# Patient Record
Sex: Female | Born: 1950 | Race: White | Hispanic: No | Smoking: Former smoker
Health system: Southern US, Community
[De-identification: ages and names within clinical notes are randomized; demographics above are authoritative.]

## PROBLEM LIST (undated history)

## (undated) DIAGNOSIS — C4490 Unspecified malignant neoplasm of skin, unspecified: Secondary | ICD-10-CM

## (undated) DIAGNOSIS — M810 Age-related osteoporosis without current pathological fracture: Secondary | ICD-10-CM

## (undated) HISTORY — DX: Age-related osteoporosis without current pathological fracture: M81.0

## (undated) HISTORY — DX: Unspecified malignant neoplasm of skin, unspecified: C44.90

---

## 1999-06-06 ENCOUNTER — Emergency Department (HOSPITAL_COMMUNITY): Admission: EM | Admit: 1999-06-06 | Discharge: 1999-06-06 | Payer: Self-pay | Admitting: Emergency Medicine

## 1999-06-06 ENCOUNTER — Encounter: Payer: Self-pay | Admitting: Emergency Medicine

## 2012-08-05 ENCOUNTER — Other Ambulatory Visit: Payer: Self-pay | Admitting: Family Medicine

## 2014-06-22 DIAGNOSIS — C50311 Malignant neoplasm of lower-inner quadrant of right female breast: Secondary | ICD-10-CM | POA: Insufficient documentation

## 2014-06-22 DIAGNOSIS — C50919 Malignant neoplasm of unspecified site of unspecified female breast: Secondary | ICD-10-CM

## 2014-06-22 HISTORY — DX: Malignant neoplasm of unspecified site of unspecified female breast: C50.919

## 2014-06-29 ENCOUNTER — Other Ambulatory Visit (INDEPENDENT_AMBULATORY_CARE_PROVIDER_SITE_OTHER): Payer: Self-pay | Admitting: General Surgery

## 2014-06-29 DIAGNOSIS — C50911 Malignant neoplasm of unspecified site of right female breast: Secondary | ICD-10-CM

## 2014-07-01 ENCOUNTER — Other Ambulatory Visit: Payer: Self-pay | Admitting: *Deleted

## 2014-07-01 ENCOUNTER — Telehealth: Payer: Self-pay | Admitting: *Deleted

## 2014-07-01 NOTE — Telephone Encounter (Signed)
Pt returned my call and I confirmed 07/05/14 genetic appt w/ her.

## 2014-07-01 NOTE — Telephone Encounter (Signed)
Received referral from Dr. Barry Dienes requesting a genetic appt.  Called and left a message for the pt to return my call so I can schedule it.

## 2014-07-05 ENCOUNTER — Ambulatory Visit (HOSPITAL_BASED_OUTPATIENT_CLINIC_OR_DEPARTMENT_OTHER): Payer: Medicare Other | Admitting: Genetic Counselor

## 2014-07-05 ENCOUNTER — Encounter: Payer: Self-pay | Admitting: Genetic Counselor

## 2014-07-05 ENCOUNTER — Other Ambulatory Visit: Payer: Medicare Other

## 2014-07-05 DIAGNOSIS — Z808 Family history of malignant neoplasm of other organs or systems: Secondary | ICD-10-CM

## 2014-07-05 DIAGNOSIS — C50919 Malignant neoplasm of unspecified site of unspecified female breast: Secondary | ICD-10-CM

## 2014-07-05 DIAGNOSIS — Z803 Family history of malignant neoplasm of breast: Secondary | ICD-10-CM

## 2014-07-05 DIAGNOSIS — Z800 Family history of malignant neoplasm of digestive organs: Secondary | ICD-10-CM

## 2014-07-05 DIAGNOSIS — C50911 Malignant neoplasm of unspecified site of right female breast: Secondary | ICD-10-CM | POA: Insufficient documentation

## 2014-07-05 DIAGNOSIS — IMO0002 Reserved for concepts with insufficient information to code with codable children: Secondary | ICD-10-CM

## 2014-07-05 NOTE — Progress Notes (Signed)
HISTORY OF PRESENT ILLNESS: Dr. Barry Blackburn requested a cancer genetics consultation for Vanessa Blackburn Blackburn, a 63 y.o. female, due to a personal and family history of cancer.  Vanessa Blackburn Blackburn presents to clinic today to discuss the possibility of a hereditary predisposition to cancer, genetic testing, and to further clarify her future cancer risks, as well as potential cancer risk for family members. Vanessa Blackburn Blackburn was recently diagnosed with right breast cancer (receptors, stage and type of cancer unknown and not available in today's records). She is currently scheduled to meet with several oncology providers to determine a treatment plan. She would like to use genetic test results to help make surgical decisions. 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 At today's visit, we primarily focused on Vanessa Blackburn Blackburn's family history.   FAMILY HISTORY:  During the visit, a 4-generation pedigree was obtained. Significant diagnoses include the following:  Family History  Problem Relation Age of Onset   Cancer Mother 56    female cancer - unknown type   Cancer Father 57    throat cancer; +cig and ETOH   Cancer Sister 77    breast   Cancer Brother 25    colorectal   Cancer Maternal Aunt     three maternal aunts with breast cancer at young ages   65 Paternal Aunt     breast cancer at an unknown age   57 Maternal Grandmother     breast cancer at a young age; stomach cancer at an older age   65 Maternal Grandfather     brain cancer at an older age   70 Paternal Grandmother     bone cancer at an older age   39 Sister 8    melanoma on back    Vanessa Blackburn Blackburn's ancestry is of Caucasian descent. There is no known Jewish ancestry or consanguinity.  GENETIC COUNSELING ASSESSMENT: Vanessa Blackburn Blackburn is a 63 y.o. female with a personal and family history of cancer highly suggestive of a hereditary predisposition  to cancer. We, therefore, discussed and recommended the following at today's visit.   DISCUSSION: We reviewed the characteristics, features and inheritance patterns of hereditary cancer syndromes. We also discussed genetic testing, including the appropriate family members to test, the process of testing, insurance coverage and turn-around-time for results. We discussed the implications of a negative, positive and/or variant of uncertain significant result. We recommended Vanessa Blackburn Blackburn pursue genetic testing for the BRCA1 and BCRA2 genes. If this testing is negative, we recommended she pursue genetic testing for the OvaNext gene panel which looks at additional genes associated with and increased risk for breast, colorectal, stomach and female cancers.   PLAN: Based on our above recommendation, Vanessa Blackburn Blackburn wished to pursue genetic testing and the blood sample was drawn and will be sent to [facility] for analysis. Results for BRCA1 and BRCA2 should be available within approximately 2 weeks time, at which point they will be disclosed by telephone to Vanessa Blackburn Blackburn, as will any additional recommendations warranted by these results. If gene panel testing is then pursued, this result will take an additional 2 weeks and will also be disclosed as soon as available. We also encouraged Vanessa Blackburn Blackburn to remain in contact with cancer genetics annually so that we can continuously update the family history and inform her of any changes in cancer genetics and testing that may be of benefit for this family.  [name] questions were answered to her satisfaction today. Our contact information was provided should additional questions or concerns arise.   Thank you for the referral and allowing [place] to share in the care of your patient.   The patient was seen for a total of 45 minutes in face-to-face genetic counseling.  This patient was discussed with Dr. Jana Blackburn who agrees with the above.     _______________________________________________________________________ For Office Staff:  Number of people involved in session: 2 Was an Intern/ student involved with case: not applicable

## 2014-07-06 ENCOUNTER — Ambulatory Visit: Payer: Self-pay | Admitting: Radiation Oncology

## 2014-07-17 ENCOUNTER — Encounter: Payer: Self-pay | Admitting: Genetic Counselor

## 2014-07-17 DIAGNOSIS — Z800 Family history of malignant neoplasm of digestive organs: Secondary | ICD-10-CM

## 2014-07-17 DIAGNOSIS — Z803 Family history of malignant neoplasm of breast: Secondary | ICD-10-CM

## 2014-07-17 DIAGNOSIS — C50919 Malignant neoplasm of unspecified site of unspecified female breast: Secondary | ICD-10-CM

## 2014-07-17 NOTE — Progress Notes (Signed)
Ms. Diefendorf recently had cancer genetic counseling at [facility] on July 05, 2014. At that time, it was recommended she pursue genetic testing. Her BRCA1 and BRCA2 gene test, which was performed at [facility], has returned and is negative for mutations. These results were disclosed to her today. Per her request, reflex testing for the OvaNext gene panel at [facility] was initiated. Results for the gene panel should be available in 2-3 more weeks and we will contact her to discuss these results and recommendations warranted by these results.

## 2014-08-02 ENCOUNTER — Encounter: Payer: Self-pay | Admitting: Genetic Counselor

## 2014-08-02 DIAGNOSIS — Z803 Family history of malignant neoplasm of breast: Secondary | ICD-10-CM

## 2014-08-02 DIAGNOSIS — C50919 Malignant neoplasm of unspecified site of unspecified female breast: Secondary | ICD-10-CM

## 2014-08-02 DIAGNOSIS — Z800 Family history of malignant neoplasm of digestive organs: Secondary | ICD-10-CM

## 2014-08-02 NOTE — Progress Notes (Signed)
HPI:  Ms. Pointer was previously seen in the [place] clinic due to a personal and family history of cancer and concerns regarding a hereditary predisposition to cancer. Please refer to our prior cancer genetics clinic note for more information regarding Ms. Tatro's medical, social and family histories, and our assessment and recommendations, at the time. Ms. Golliday recent genetic test results were disclosed to her, as were recommendations warranted by these results. These results and recommendations are discussed in more detail below.  GENETIC TEST RESULTS: At the time of Ms. Grawe's visit, we recommended she pursue genetic testing of the OvaNext gene panel. This test, which included sequencing and deletion/duplication analysis of the genes, was performed at [facility]. Genetic testing was normal, and did not reveal a deleterious mutation in these genes. A complete list of all genes tested is located on the test report scanned into [employer].    We discussed with Ms. Dillehay that since the current genetic testing is not perfect, it is possible there may be a gene mutation in one of these genes that current testing cannot detect, but that chance is small.  We also discussed, that it is possible that another gene that has not yet been discovered, or that we have not yet tested, is responsible for the cancer diagnoses in the family, and it is, therefore, important to remain in touch with cancer genetics in the future so that we can continue to offer Ms. Melick the most up to date genetic testing.   CANCER SCREENING RECOMMENDATIONS: This result is reassuring and suggests that Ms. Minervini's cancer was most likely not due to an inherited predisposition associated with one of these genes.  Most cancers happen by chance and this negative test, suggests that her cancer falls into this category.  We, therefore, recommended she continue to follow the cancer management and screening  guidelines provided by her oncology and primary providers.   RECOMMENDATIONS FOR FAMILY MEMBERS:  While these results are reassuring for Ms. Slayton and her children, this test does not tell us anything about Ms. Timothy's relatives' risks. It is possible there is a hereditary cancer syndrome in this family that Ms. Dewald did not inherit, rather she happened to get breast cancer by chance. We recommended these relatives (her siblings and maternal relatives) also have genetic counseling and testing. Please let us know if we can help facilitate testing. Genetic counselors can be located in other cities, by visiting the website of the [employer] ([employer]) and Field seismologist for a Dietitian by zip code.   FOLLOW-UP: Lastly, we discussed with Ms. Borello that cancer genetics is a rapidly advancing field and it is possible that new genetic tests will be appropriate for her and/or her family members in the future. We encouraged her to remain in contact with cancer genetics on an annual basis so we can update her personal and family histories and let her know of advances in cancer genetics that may benefit this family.   Our contact number was provided. Ms. Mayorga questions were answered to her satisfaction, and she knows she is welcome to call us at anytime with additional questions or concerns.   Catherine A. Fine, MS, CGC Certified Psychologist, sport and [email or web address]

## 2014-11-16 DIAGNOSIS — C439 Malignant melanoma of skin, unspecified: Secondary | ICD-10-CM | POA: Insufficient documentation

## 2014-12-27 DIAGNOSIS — D039 Melanoma in situ, unspecified: Secondary | ICD-10-CM | POA: Insufficient documentation

## 2014-12-27 DIAGNOSIS — D0352 Melanoma in situ of breast (skin) (soft tissue): Secondary | ICD-10-CM

## 2014-12-27 HISTORY — DX: Melanoma in situ of breast (skin) (soft tissue): D03.52

## 2015-01-17 ENCOUNTER — Telehealth: Payer: Self-pay | Admitting: Genetic Counselor

## 2015-01-17 NOTE — Telephone Encounter (Signed)
Daughter, Manfred Shirts called to get information about her mother's genetic test result.  Patient was seen by Leda Quail.  Daughter is asking whether more extensive genetic testing was performed than just BRCA1 and BRCA2 testing.  Confirmed that an ovanext panel test was performed.  Tried to route the test result to Dr. Hinton Rao, asking that she please contact me if she is unable to see that result.

## 2015-08-24 ENCOUNTER — Other Ambulatory Visit: Payer: Self-pay | Admitting: Rheumatology

## 2015-08-24 ENCOUNTER — Ambulatory Visit
Admission: RE | Admit: 2015-08-24 | Discharge: 2015-08-24 | Disposition: A | Discharge status: Home / Self Care | Payer: Medicare Other | Source: Ambulatory Visit | Attending: Rheumatology | Admitting: Rheumatology

## 2015-08-24 DIAGNOSIS — M545 Low back pain: Secondary | ICD-10-CM

## 2015-10-26 DIAGNOSIS — C4490 Unspecified malignant neoplasm of skin, unspecified: Secondary | ICD-10-CM

## 2015-10-26 DIAGNOSIS — M8580 Other specified disorders of bone density and structure, unspecified site: Secondary | ICD-10-CM

## 2015-10-26 DIAGNOSIS — I890 Lymphedema, not elsewhere classified: Secondary | ICD-10-CM

## 2015-10-26 DIAGNOSIS — C50911 Malignant neoplasm of unspecified site of right female breast: Secondary | ICD-10-CM | POA: Diagnosis not present

## 2015-10-26 DIAGNOSIS — D0359 Melanoma in situ of other part of trunk: Secondary | ICD-10-CM | POA: Diagnosis not present

## 2015-10-26 DIAGNOSIS — C4352 Malignant melanoma of skin of breast: Secondary | ICD-10-CM | POA: Diagnosis not present

## 2015-10-26 DIAGNOSIS — C773 Secondary and unspecified malignant neoplasm of axilla and upper limb lymph nodes: Secondary | ICD-10-CM | POA: Diagnosis not present

## 2016-04-29 DIAGNOSIS — C4490 Unspecified malignant neoplasm of skin, unspecified: Secondary | ICD-10-CM | POA: Diagnosis not present

## 2016-04-29 DIAGNOSIS — D0359 Melanoma in situ of other part of trunk: Secondary | ICD-10-CM | POA: Diagnosis not present

## 2016-04-29 DIAGNOSIS — Z87891 Personal history of nicotine dependence: Secondary | ICD-10-CM

## 2016-04-29 DIAGNOSIS — I890 Lymphedema, not elsewhere classified: Secondary | ICD-10-CM | POA: Diagnosis not present

## 2016-04-29 DIAGNOSIS — M8580 Other specified disorders of bone density and structure, unspecified site: Secondary | ICD-10-CM

## 2016-04-29 DIAGNOSIS — C50411 Malignant neoplasm of upper-outer quadrant of right female breast: Secondary | ICD-10-CM | POA: Diagnosis not present

## 2016-08-28 DIAGNOSIS — R609 Edema, unspecified: Secondary | ICD-10-CM | POA: Diagnosis not present

## 2016-08-28 DIAGNOSIS — Z85820 Personal history of malignant melanoma of skin: Secondary | ICD-10-CM

## 2016-08-28 DIAGNOSIS — C50311 Malignant neoplasm of lower-inner quadrant of right female breast: Secondary | ICD-10-CM | POA: Diagnosis not present

## 2016-08-28 DIAGNOSIS — M8580 Other specified disorders of bone density and structure, unspecified site: Secondary | ICD-10-CM | POA: Diagnosis not present

## 2016-08-28 DIAGNOSIS — Z170 Estrogen receptor positive status [ER+]: Secondary | ICD-10-CM

## 2016-09-26 ENCOUNTER — Encounter: Payer: Self-pay | Admitting: Sports Medicine

## 2016-09-26 ENCOUNTER — Ambulatory Visit (INDEPENDENT_AMBULATORY_CARE_PROVIDER_SITE_OTHER): Payer: Medicare Other

## 2016-09-26 ENCOUNTER — Ambulatory Visit (INDEPENDENT_AMBULATORY_CARE_PROVIDER_SITE_OTHER): Payer: Medicare Other | Admitting: Sports Medicine

## 2016-09-26 DIAGNOSIS — M79672 Pain in left foot: Secondary | ICD-10-CM

## 2016-09-26 DIAGNOSIS — S93402A Sprain of unspecified ligament of left ankle, initial encounter: Secondary | ICD-10-CM

## 2016-09-26 DIAGNOSIS — S93502A Unspecified sprain of left great toe, initial encounter: Secondary | ICD-10-CM | POA: Diagnosis not present

## 2016-09-26 NOTE — Progress Notes (Signed)
Subjective: Vanessa Blackburn is a 65 y.o. female patient who presents to office for evaluation of Left foot pain. Patient states that 2 months ago she injured her foot by tripping and falling and then re-injured it 2-3 weeks ago when she twisted her ankle while hanging a picture. States that she went to see her PCP who gave her naproxen with no relief. However, with time, has gotten better. Reports that rest has helped and soaking and now feels better. Denies any other pedal complaints.   Patient Active Problem List   Diagnosis Date Noted  . Melanoma ([place]) 11/16/2014  . Malignant neoplasm of right breast ([place]) 07/05/2014  . Family history of malignant neoplasm of gastrointestinal tract 07/05/2014  . Family history of malignant neoplasm of breast 07/05/2014    No current outpatient prescriptions on file prior to visit.   No current facility-administered medications on file prior to visit.     Allergies  Allergen Reactions  . Ciprofloxacin Other (See Comments)    unknown    Objective:  General: Alert and oriented x3 in no acute distress  Dermatology: No open lesions bilateral lower extremities, no webspace macerations, no ecchymosis bilateral, all nails x 10 are well manicured.  Vascular: Dorsalis Pedis and Posterior Tibial pedal pulses palpable, Capillary Fill Time 3 seconds,Scant pedal hair growth bilateral, no edema bilateral lower extremities, Temperature gradient within normal limits.  Neurology: Johney Maine sensation intact via light touch bilateral, Protective sensation intact with Thornell Mule Monofilament to all pedal sites, Position sense intact, vibratory intact bilateral, Deep tendon reflexes within normal limits bilateral, No babinski sign present bilateral. (- )Tinels sign bilateral.   Musculoskeletal: No reproducible tenderness to palpation of left foot or ankle at this time,No pain with calf compression bilateral. There is decreased ankle rom with knee extending  vs flexed  resembling gastroc equnius bilateral, Subtalar joint range of motion is within normal limits, there is no 1st ray hypermobility noted bilateral, decreased 1st MPJ rom bilateral with functional limitus and mild lesser hammertoe noted on weightbearing exam. Strength within normal limits in all groups bilateral.   Gait: non-antalgic gait  Xrays  Left Foot   Impression: Decrease osseous mineralization. There is a very small chip fracture at the medial aspect of the base of the proximal phalanx of the hallux with no gross displacement or extraosseous involvement. There is mild lesser hammertoe after she is within normal limits. No other acute findings.  Assessment and Plan: Problem List Items Addressed This Visit    None    Visit Diagnoses    Left foot pain    -  Primary   Relevant Orders   DG Foot 2 Views Left   Sprain of left ankle, unspecified ligament, initial encounter       Resolved   Sprain of left great toe, initial encounter       Resolved with small chip fracture on xray       -Complete examination performed -Xrays reviewed -Discussed treatement optionsFor likely sprain at ankle and great big toe which is now resolved -Recommend protection, rest, ice, elevation to prevent reoccurrence of symptoms -Recommend good supportive shoes to prevent recurrence of symptoms -Patient to return to office as needed or sooner if condition worsens.  Landis Martins, DPM

## 2017-02-24 DIAGNOSIS — Z86008 Personal history of in-situ neoplasm of other site: Secondary | ICD-10-CM | POA: Diagnosis not present

## 2017-02-24 DIAGNOSIS — Z85820 Personal history of malignant melanoma of skin: Secondary | ICD-10-CM | POA: Diagnosis not present

## 2017-02-24 DIAGNOSIS — C50911 Malignant neoplasm of unspecified site of right female breast: Secondary | ICD-10-CM | POA: Diagnosis not present

## 2017-02-24 DIAGNOSIS — Z923 Personal history of irradiation: Secondary | ICD-10-CM | POA: Diagnosis not present

## 2017-02-24 DIAGNOSIS — I890 Lymphedema, not elsewhere classified: Secondary | ICD-10-CM | POA: Diagnosis not present

## 2017-02-24 DIAGNOSIS — M810 Age-related osteoporosis without current pathological fracture: Secondary | ICD-10-CM | POA: Diagnosis not present

## 2017-02-24 DIAGNOSIS — Z9221 Personal history of antineoplastic chemotherapy: Secondary | ICD-10-CM | POA: Diagnosis not present

## 2017-06-20 DIAGNOSIS — Z86008 Personal history of in-situ neoplasm of other site: Secondary | ICD-10-CM | POA: Diagnosis not present

## 2017-06-20 DIAGNOSIS — Z85820 Personal history of malignant melanoma of skin: Secondary | ICD-10-CM | POA: Diagnosis not present

## 2017-06-20 DIAGNOSIS — Z9221 Personal history of antineoplastic chemotherapy: Secondary | ICD-10-CM | POA: Diagnosis not present

## 2017-06-20 DIAGNOSIS — Z923 Personal history of irradiation: Secondary | ICD-10-CM | POA: Diagnosis not present

## 2017-06-20 DIAGNOSIS — R590 Localized enlarged lymph nodes: Secondary | ICD-10-CM | POA: Diagnosis not present

## 2017-06-20 DIAGNOSIS — Z79811 Long term (current) use of aromatase inhibitors: Secondary | ICD-10-CM | POA: Diagnosis not present

## 2017-06-20 DIAGNOSIS — C50911 Malignant neoplasm of unspecified site of right female breast: Secondary | ICD-10-CM | POA: Diagnosis not present

## 2017-06-20 DIAGNOSIS — M810 Age-related osteoporosis without current pathological fracture: Secondary | ICD-10-CM | POA: Diagnosis not present

## 2017-06-20 DIAGNOSIS — I10 Essential (primary) hypertension: Secondary | ICD-10-CM

## 2017-06-20 DIAGNOSIS — F068 Other specified mental disorders due to known physiological condition: Secondary | ICD-10-CM | POA: Diagnosis not present

## 2017-10-23 DIAGNOSIS — Z85820 Personal history of malignant melanoma of skin: Secondary | ICD-10-CM

## 2017-10-23 DIAGNOSIS — R222 Localized swelling, mass and lump, trunk: Secondary | ICD-10-CM | POA: Diagnosis not present

## 2017-10-23 DIAGNOSIS — I890 Lymphedema, not elsewhere classified: Secondary | ICD-10-CM

## 2017-10-23 DIAGNOSIS — Z923 Personal history of irradiation: Secondary | ICD-10-CM

## 2017-10-23 DIAGNOSIS — Z86008 Personal history of in-situ neoplasm of other site: Secondary | ICD-10-CM | POA: Diagnosis not present

## 2017-10-23 DIAGNOSIS — R410 Disorientation, unspecified: Secondary | ICD-10-CM

## 2017-10-23 DIAGNOSIS — C50911 Malignant neoplasm of unspecified site of right female breast: Secondary | ICD-10-CM | POA: Diagnosis not present

## 2017-10-23 DIAGNOSIS — Z9221 Personal history of antineoplastic chemotherapy: Secondary | ICD-10-CM

## 2017-10-23 DIAGNOSIS — Z79811 Long term (current) use of aromatase inhibitors: Secondary | ICD-10-CM | POA: Diagnosis not present

## 2017-10-23 DIAGNOSIS — M810 Age-related osteoporosis without current pathological fracture: Secondary | ICD-10-CM | POA: Diagnosis not present

## 2018-02-19 DIAGNOSIS — Z86008 Personal history of in-situ neoplasm of other site: Secondary | ICD-10-CM | POA: Diagnosis not present

## 2018-02-19 DIAGNOSIS — M810 Age-related osteoporosis without current pathological fracture: Secondary | ICD-10-CM

## 2018-02-19 DIAGNOSIS — I890 Lymphedema, not elsewhere classified: Secondary | ICD-10-CM | POA: Diagnosis not present

## 2018-02-19 DIAGNOSIS — Z85820 Personal history of malignant melanoma of skin: Secondary | ICD-10-CM | POA: Diagnosis not present

## 2018-02-19 DIAGNOSIS — Z85828 Personal history of other malignant neoplasm of skin: Secondary | ICD-10-CM | POA: Diagnosis not present

## 2018-02-19 DIAGNOSIS — Z79811 Long term (current) use of aromatase inhibitors: Secondary | ICD-10-CM | POA: Diagnosis not present

## 2018-02-19 DIAGNOSIS — C50911 Malignant neoplasm of unspecified site of right female breast: Secondary | ICD-10-CM

## 2018-02-19 DIAGNOSIS — Z923 Personal history of irradiation: Secondary | ICD-10-CM | POA: Diagnosis not present

## 2018-02-19 DIAGNOSIS — Z9221 Personal history of antineoplastic chemotherapy: Secondary | ICD-10-CM

## 2018-05-08 ENCOUNTER — Ambulatory Visit (INDEPENDENT_AMBULATORY_CARE_PROVIDER_SITE_OTHER): Payer: Medicare HMO | Admitting: Orthopaedic Surgery

## 2018-05-08 ENCOUNTER — Ambulatory Visit (INDEPENDENT_AMBULATORY_CARE_PROVIDER_SITE_OTHER): Payer: Medicare HMO

## 2018-05-08 DIAGNOSIS — M5441 Lumbago with sciatica, right side: Secondary | ICD-10-CM | POA: Diagnosis not present

## 2018-05-08 DIAGNOSIS — M5442 Lumbago with sciatica, left side: Secondary | ICD-10-CM

## 2018-05-08 DIAGNOSIS — G8929 Other chronic pain: Secondary | ICD-10-CM | POA: Diagnosis not present

## 2018-05-08 NOTE — Progress Notes (Signed)
Office Visit Note   [name]           Date of Birth: Dec 12, 1950           MRN: [id number] Visit Date: 05/08/2018              Requested by: Gavin Pound, [address] [place], [place] [address] PCP: System, Pcp Not In   Assessment & Plan: Visit Diagnoses:  1. Chronic bilateral low back pain with bilateral sciatica     Plan: Impression is lumbar spinal stenosis and neurogenic claudication.  Recommend MRI to evaluate for spinal stenosis.  Follow-up after the MRI.  Follow-Up Instructions: Return in about 2 weeks (around 05/22/2018).   Orders:  Orders Placed This Encounter  Procedures  . XR Lumbar Spine 2-3 Views  . MR Lumbar Spine w/o contrast   No orders of the defined types were placed in this encounter.     Procedures: No procedures performed   Clinical Data: No additional findings.   Subjective: Chief Complaint  Patient presents with  . Lower Back - Pain    Patient is a female who comes in with low back pain with occasional radiation down her legs for many years with recent worsening.  She endorses a positive grocery cart sign.  She has tried physical therapy without any significant relief or improvement.  She denies any bowel bladder dysfunction but does endorse neurogenic patient.   Review of Systems  Constitutional: Negative.   HENT: Negative.   Eyes: Negative.   Respiratory: Negative.   Cardiovascular: Negative.   Endocrine: Negative.   Musculoskeletal: Negative.   Neurological: Negative.   Hematological: Negative.   Psychiatric/Behavioral: Negative.   All other systems reviewed and are negative.    Objective: Vital Signs: There were no vitals taken for this visit.  Physical Exam  Constitutional: She is oriented to person, place, and time. She appears well-developed and well-nourished.  HENT:  Head: Normocephalic and atraumatic.  Eyes: EOM are normal.  Neck: Neck supple.  Pulmonary/Chest: Effort normal.    Abdominal: Soft.  Neurological: She is alert and oriented to person, place, and time.  Skin: Skin is warm. Capillary refill takes less than 2 seconds.  Psychiatric: She has a normal mood and affect. Her behavior is normal. Judgment and thought content normal.  Nursing note and vitals reviewed.   Ortho Exam Bilateral lower extremity exam shows normal and symmetric reflexes.  No spasticity.  No focal motor or sensory deficits.  Lumbar spine is tender. Specialty Comments:  No specialty comments available.  Imaging: Xr Lumbar Spine 2-3 Views  Result Date: 05/08/2018 L5-S1 grade 1-2 spondylolisthesis    PMFS History: Patient Active Problem List   Diagnosis Date Noted  . Melanoma ([place]) 11/16/2014  . Malignant neoplasm of right breast ([place]) 07/05/2014  . Family history of malignant neoplasm of gastrointestinal tract 07/05/2014  . Family history of malignant neoplasm of breast 07/05/2014   No past medical history on file.  Family History  Problem Relation Age of Onset  . Cancer Mother 64       female cancer - unknown type  . Cancer Father 58       throat cancer; +cig and ETOH  . Cancer Sister 31       breast  . Cancer Brother 65       colorectal  . Cancer Maternal Aunt        three maternal aunts with breast cancer at young  ages  . Cancer Paternal Aunt        breast cancer at an unknown age  . Cancer Maternal Grandmother        breast cancer at a young age; stomach cancer at an older age  . Cancer Maternal Grandfather        brain cancer at an older age  . Cancer Paternal Grandmother        bone cancer at an older age  . Cancer Sister 36       melanoma on back     Social History   Occupational History  . Not on file  Tobacco Use  . Smoking status: Unknown If Ever Smoked  Substance and Sexual Activity  . Alcohol use: Not on file  . Drug use: Not on file  . Sexual activity: Not on file

## 2018-05-23 ENCOUNTER — Ambulatory Visit
Admission: RE | Admit: 2018-05-23 | Discharge: 2018-05-23 | Disposition: A | Discharge status: Home / Self Care | Payer: Medicare HMO | Source: Ambulatory Visit | Attending: Orthopaedic Surgery | Admitting: Orthopaedic Surgery

## 2018-05-23 DIAGNOSIS — M5441 Lumbago with sciatica, right side: Principal | ICD-10-CM

## 2018-05-23 DIAGNOSIS — M5442 Lumbago with sciatica, left side: Principal | ICD-10-CM

## 2018-05-23 DIAGNOSIS — G8929 Other chronic pain: Secondary | ICD-10-CM

## 2018-06-02 ENCOUNTER — Ambulatory Visit (INDEPENDENT_AMBULATORY_CARE_PROVIDER_SITE_OTHER): Payer: Medicare HMO | Admitting: Orthopaedic Surgery

## 2018-06-02 ENCOUNTER — Encounter (INDEPENDENT_AMBULATORY_CARE_PROVIDER_SITE_OTHER): Payer: Self-pay | Admitting: Orthopaedic Surgery

## 2018-06-02 DIAGNOSIS — G8929 Other chronic pain: Secondary | ICD-10-CM | POA: Diagnosis not present

## 2018-06-02 DIAGNOSIS — M5441 Lumbago with sciatica, right side: Secondary | ICD-10-CM

## 2018-06-02 DIAGNOSIS — M5442 Lumbago with sciatica, left side: Secondary | ICD-10-CM | POA: Diagnosis not present

## 2018-06-02 NOTE — Addendum Note (Signed)
Addended by: Precious Bard on: 06/02/2018 04:20 PM   Modules accepted: Orders

## 2018-06-02 NOTE — Progress Notes (Signed)
   Office Visit Note   [name]           Date of Birth: 12/03/1950           MRN: [id number] Visit Date: 06/02/2018              Requested by: No referring provider defined for this encounter. PCP: Enid Skeens., MD   Assessment & Plan: Visit Diagnoses:  1. Chronic bilateral low back pain with bilateral sciatica     Plan: MRI findings are significant for L5-S1 anterolisthesis and resultant canal stenosis.  She has facet disease at L4-L5 and L5-S1.  These findings were discussed with the patient and recommendation is to try epidural steroid injection to see if this will alleviate her symptoms.  She is in agreement.  Questions encouraged and answered.  Follow-up as needed with me.  Follow-Up Instructions: Return if symptoms worsen or fail to improve.   Orders:  No orders of the defined types were placed in this encounter.  No orders of the defined types were placed in this encounter.     Procedures: No procedures performed   Clinical Data: No additional findings.   Subjective: Chief Complaint  Patient presents with  . Lower Back - Pain    Vanessa Blackburn follows up today for MRI review.  She continues to have low back pain.   Review of Systems   Objective: Vital Signs: There were no vitals taken for this visit.  Physical Exam  Ortho Exam Exam is stable. Specialty Comments:  No specialty comments available.  Imaging: No results found.   PMFS History: Patient Active Problem List   Diagnosis Date Noted  . Melanoma ([place]) 11/16/2014  . Malignant neoplasm of right breast ([place]) 07/05/2014  . Family history of malignant neoplasm of gastrointestinal tract 07/05/2014  . Family history of malignant neoplasm of breast 07/05/2014   History reviewed. No pertinent past medical history.  Family History  Problem Relation Age of Onset  . Cancer Mother 1       female cancer - unknown type  . Cancer Father 47       throat cancer; +cig and ETOH  .  Cancer Sister 42       breast  . Cancer Brother 8       colorectal  . Cancer Maternal Aunt        three maternal aunts with breast cancer at young ages  . Cancer Paternal Aunt        breast cancer at an unknown age  . Cancer Maternal Grandmother        breast cancer at a young age; stomach cancer at an older age  . Cancer Maternal Grandfather        brain cancer at an older age  . Cancer Paternal Grandmother        bone cancer at an older age  . Cancer Sister 40       melanoma on back    History reviewed. No pertinent surgical history. Social History   Occupational History  . Not on file  Tobacco Use  . Smoking status: Unknown If Ever Smoked  . Smokeless tobacco: Never Used  Substance and Sexual Activity  . Alcohol use: Not on file  . Drug use: Not on file  . Sexual activity: Not on file

## 2018-06-15 ENCOUNTER — Ambulatory Visit (INDEPENDENT_AMBULATORY_CARE_PROVIDER_SITE_OTHER): Payer: Self-pay

## 2018-06-15 ENCOUNTER — Encounter (INDEPENDENT_AMBULATORY_CARE_PROVIDER_SITE_OTHER): Payer: Self-pay | Admitting: Physical Medicine and Rehabilitation

## 2018-06-15 ENCOUNTER — Ambulatory Visit (INDEPENDENT_AMBULATORY_CARE_PROVIDER_SITE_OTHER): Payer: Medicare HMO | Admitting: Physical Medicine and Rehabilitation

## 2018-06-15 ENCOUNTER — Encounter

## 2018-06-15 VITALS — BP 161/87 | HR 86

## 2018-06-15 DIAGNOSIS — M5416 Radiculopathy, lumbar region: Secondary | ICD-10-CM | POA: Diagnosis not present

## 2018-06-15 MED ORDER — BETAMETHASONE SOD PHOS & ACET 6 (3-3) MG/ML IJ SUSP
12.0000 mg | Freq: Once | INTRAMUSCULAR | Status: AC
  Administered 2018-06-15: 12 mg

## 2018-06-15 NOTE — Patient Instructions (Signed)

## 2018-06-15 NOTE — Progress Notes (Signed)
 .  Numeric Pain Rating Scale and Functional Assessment Average Pain 8   In the last MONTH (on 0-10 scale) has pain interfered with the following?  1. General activity like being  able to carry out your everyday physical activities such as walking, climbing stairs, carrying groceries, or moving a chair?  Rating(6)   +Driver, -BT, -Dye Allergies.  

## 2018-06-29 NOTE — Progress Notes (Signed)
Vanessa Blackburn - 67 y.o. female MRN [id number]  Date of birth: 09/06/1951  Office Visit Note: Visit Date: 06/15/2018 PCP: Enid Skeens., MD Referred by: Enid Skeens., MD  Subjective: Chief Complaint  Patient presents with  . Lower Back - Pain  . Right Leg - Numbness, Pain  . Left Leg - Numbness, Pain   HPI: [name] is a 67 year old female with chronic worsening, severe 8 out of 10 pain in the lower back and buttocks that radiates into both legs equally with numbness in a somewhat nondermatomal distribution and somewhat mainly L5 distribution down the posterior lateral legs.  She reports this is been ongoing for many years.  She reports that standing too long makes it much worse and sitting seems to help.  MRI shows significant 9 mm anterior listhesis of L5 on S1 with significant facet arthropathy and stenosis both centrally in the lateral recess and foramen.  Her history is complicated by history of breast cancer GI cancer and melanoma.  We are going to complete diagnostic and hopefully therapeutic bilateral L5 transforaminal injection.   ROS Otherwise per HPI.  Assessment & Plan: Visit Diagnoses:  1. Lumbar radiculopathy     Plan: No additional findings.   Meds & Orders:  Meds ordered this encounter  Medications  . betamethasone acetate-betamethasone sodium phosphate (CELESTONE) injection 12 mg    Orders Placed This Encounter  Procedures  . XR C-ARM NO REPORT  . Epidural Steroid injection    Follow-up: Return if symptoms worsen or fail to improve.   Procedures: No procedures performed  Lumbosacral Transforaminal Epidural Steroid Injection - Sub-Pedicular Approach with Fluoroscopic Guidance  [name]      Date of Birth: 04/28/1951 MRN: [id number] PCP: Enid Skeens., MD      Visit Date: 06/15/2018   Universal Protocol:    Date/Time: 06/15/2018  Consent Given By: the patient  Position: PRONE  Additional Comments: Vital signs were  monitored before and after the procedure. Patient was prepped and draped in the usual sterile fashion. The correct patient, procedure, and site was verified.   Injection Procedure Details:  Procedure Site One Meds Administered:  Meds ordered this encounter  Medications  . betamethasone acetate-betamethasone sodium phosphate (CELESTONE) injection 12 mg    Laterality: Bilateral  Location/Site:  L5-S1  Needle size: 22 G  Needle type: Spinal  Needle Placement: Transforaminal  Findings:    -Comments: Excellent flow of contrast along the nerve and into the epidural space.  Procedure Details: After squaring off the end-plates to get a true AP view, the C-arm was positioned so that an oblique view of the foramen as noted above was visualized. The target area is just inferior to the "nose of the scotty dog" or sub pedicular. The soft tissues overlying this structure were infiltrated with 2-3 ml. of 1% Lidocaine without Epinephrine.  The spinal needle was inserted toward the target using a "trajectory" view along the fluoroscope beam.  Under AP and lateral visualization, the needle was advanced so it did not puncture dura and was located close the 6 O'Clock position of the pedical in AP tracterory. Biplanar projections were used to confirm position. Aspiration was confirmed to be negative for CSF and/or blood. A 1-2 ml. volume of Isovue-250 was injected and flow of contrast was noted at each level. Radiographs were obtained for documentation purposes.   After attaining the desired flow of contrast documented above, a 0.5 to 1.0 ml test dose of 0.25%  Marcaine was injected into each respective transforaminal space.  The patient was observed for 90 seconds post injection.  After no sensory deficits were reported, and normal lower extremity motor function was noted,   the above injectate was administered so that equal amounts of the injectate were placed at each foramen (level) into the  transforaminal epidural space.   Additional Comments:  The patient tolerated the procedure well Dressing: Band-Aid    Post-procedure details: Patient was observed during the procedure. Post-procedure instructions were reviewed.  Patient left the clinic in stable condition.    Clinical History: MRI LUMBAR SPINE WITHOUT CONTRAST  TECHNIQUE: Multiplanar, multisequence MR imaging of the lumbar spine was performed. No intravenous contrast was administered.  COMPARISON:  Radiography 05/08/2018.  CT abdomen 05/10/2015.  FINDINGS: Segmentation:  5 lumbar type vertebral bodies.  Alignment:  9 mm anterolisthesis L5-S1.  Vertebrae:  No fracture or primary bone lesion.  Conus medullaris and cauda equina: Conus extends to the L1 level. Conus and cauda equina appear normal.  Paraspinal and other soft tissues: Negative  Disc levels:  No significant finding at L3-4 or above. No canal or foraminal stenosis.  L4-5: Minimal noncompressive disc bulge. Facet osteoarthritis with mild edema which could contribute to back pain. No compressive stenosis.  L5-S1: Chronic bilateral facet arthropathy with 9 mm of anterolisthesis. Bulging of the disc. Canal and foraminal stenosis that could cause neural compression on either or both sides. No pars defect was seen on the CT scan of 2016, but it is possible that there could be acquired pars deficiency in this patient with chronic facet arthropathy and slippage.  IMPRESSION: L4-5: No stenosis. Facet osteoarthritis with mild edema that could be symptomatic.  L5-S1: Advanced bilateral facet arthropathy with 9 mm of anterolisthesis. Bulging of the disc. Stenosis of the canal and foramina that could cause neural compression on either or both sides and could certainly relate to low back pain. I think the primary process is that of facet arthropathy rather than primary pars defect, but it is possible there could be an acquired pars  fracture in addition to the facet arthropathy.   Electronically Signed   By: Nelson Chimes M.D.   On: 05/23/2018 16:55   [name] No results for input(s): HGBA1C, LABURIC in the last 8760 hours.  Objective:  VS:  HT:    WT:   BMI:     BP:(!) 161/87  HR:86bpm  TEMP: ( )  RESP:  Physical Exam  Ortho Exam Imaging: No results found.  Past Medical/Family/Surgical/Social History: Medications & Allergies reviewed per EMR, new medications updated. Patient Active Problem List   Diagnosis Date Noted  . Melanoma ([place]) 11/16/2014  . Malignant neoplasm of right breast ([place]) 07/05/2014  . Family history of malignant neoplasm of gastrointestinal tract 07/05/2014  . Family history of malignant neoplasm of breast 07/05/2014   History reviewed. No pertinent past medical history. Family History  Problem Relation Age of Onset  . Cancer Mother 32       female cancer - unknown type  . Cancer Father 55       throat cancer; +cig and ETOH  . Cancer Sister 73       breast  . Cancer Brother 74       colorectal  . Cancer Maternal Aunt        three maternal aunts with breast cancer at young ages  . Cancer Paternal Aunt  breast cancer at an unknown age  . Cancer Maternal Grandmother        breast cancer at a young age; stomach cancer at an older age  . Cancer Maternal Grandfather        brain cancer at an older age  . Cancer Paternal Grandmother        bone cancer at an older age  . Cancer Sister 16       melanoma on back   History reviewed. No pertinent surgical history. Social History   Occupational History  . Not on file  Tobacco Use  . Smoking status: Unknown If Ever Smoked  . Smokeless tobacco: Never Used  Substance and Sexual Activity  . Alcohol use: Not on file  . Drug use: Not on file  . Sexual activity: Not on file

## 2018-06-29 NOTE — Procedures (Signed)
Lumbosacral Transforaminal Epidural Steroid Injection - Sub-Pedicular Approach with Fluoroscopic Guidance  [name]      Date of Birth: 01-08-1951 MRN: [id number] PCP: Enid Skeens., MD      Visit Date: 06/15/2018   Universal Protocol:    Date/Time: 06/15/2018  Consent Given By: the patient  Position: PRONE  Additional Comments: Vital signs were monitored before and after the procedure. Patient was prepped and draped in the usual sterile fashion. The correct patient, procedure, and site was verified.   Injection Procedure Details:  Procedure Site One Meds Administered:  Meds ordered this encounter  Medications  . betamethasone acetate-betamethasone sodium phosphate (CELESTONE) injection 12 mg    Laterality: Bilateral  Location/Site:  L5-S1  Needle size: 22 G  Needle type: Spinal  Needle Placement: Transforaminal  Findings:    -Comments: Excellent flow of contrast along the nerve and into the epidural space.  Procedure Details: After squaring off the end-plates to get a true AP view, the C-arm was positioned so that an oblique view of the foramen as noted above was visualized. The target area is just inferior to the "nose of the scotty dog" or sub pedicular. The soft tissues overlying this structure were infiltrated with 2-3 ml. of 1% Lidocaine without Epinephrine.  The spinal needle was inserted toward the target using a "trajectory" view along the fluoroscope beam.  Under AP and lateral visualization, the needle was advanced so it did not puncture dura and was located close the 6 O'Clock position of the pedical in AP tracterory. Biplanar projections were used to confirm position. Aspiration was confirmed to be negative for CSF and/or blood. A 1-2 ml. volume of Isovue-250 was injected and flow of contrast was noted at each level. Radiographs were obtained for documentation purposes.   After attaining the desired flow of contrast documented above, a  0.5 to 1.0 ml test dose of 0.25% Marcaine was injected into each respective transforaminal space.  The patient was observed for 90 seconds post injection.  After no sensory deficits were reported, and normal lower extremity motor function was noted,   the above injectate was administered so that equal amounts of the injectate were placed at each foramen (level) into the transforaminal epidural space.   Additional Comments:  The patient tolerated the procedure well Dressing: Band-Aid    Post-procedure details: Patient was observed during the procedure. Post-procedure instructions were reviewed.  Patient left the clinic in stable condition.

## 2018-10-22 ENCOUNTER — Telehealth (INDEPENDENT_AMBULATORY_CARE_PROVIDER_SITE_OTHER): Payer: Self-pay | Admitting: Physical Medicine and Rehabilitation

## 2018-10-22 NOTE — Telephone Encounter (Signed)
Auth No / Request ID [id number] Status Auto-Approved Decision Approved Effective Date 10/26/2018 Expiration Date 12/10/2018

## 2018-10-22 NOTE — Telephone Encounter (Signed)
Okay to repeat.

## 2018-10-22 NOTE — Telephone Encounter (Signed)
Pt is scheduled for 11/05/2018 with driver.

## 2018-10-22 NOTE — Telephone Encounter (Signed)
Needs auth and scheduling for bilateral [id number]. 

## 2018-10-28 ENCOUNTER — Encounter: Payer: Self-pay | Admitting: Sports Medicine

## 2018-10-28 ENCOUNTER — Ambulatory Visit (INDEPENDENT_AMBULATORY_CARE_PROVIDER_SITE_OTHER): Payer: Medicare HMO | Admitting: Sports Medicine

## 2018-10-28 ENCOUNTER — Ambulatory Visit (INDEPENDENT_AMBULATORY_CARE_PROVIDER_SITE_OTHER): Payer: Medicare HMO

## 2018-10-28 DIAGNOSIS — S93401A Sprain of unspecified ligament of right ankle, initial encounter: Secondary | ICD-10-CM | POA: Diagnosis not present

## 2018-10-28 DIAGNOSIS — M25471 Effusion, right ankle: Secondary | ICD-10-CM | POA: Diagnosis not present

## 2018-10-28 DIAGNOSIS — M79671 Pain in right foot: Secondary | ICD-10-CM

## 2018-10-28 NOTE — Progress Notes (Signed)
Subjective: Vanessa Blackburn is a 68 y.o. female patient who presents to office for evaluation of right ankle pain. Patient complains of continued pain in the ankle since 1 week states that she was walking her dog and jumped with the leash. Patient has tried ice and elevation with no relief in symptoms.  Reports pain is worse when she is attempting to walk and put pressure on her foot and ankle.  Patient denies any other pedal complaints.  Denies any redness, warmth, or fever chills or any other constitutional symptoms at this time. Patient Active Problem List   Diagnosis Date Noted  . Melanoma ([place]) 11/16/2014  . Malignant neoplasm of right breast ([place]) 07/05/2014  . Family history of malignant neoplasm of gastrointestinal tract 07/05/2014  . Family history of malignant neoplasm of breast 07/05/2014    Current Outpatient Medications on File Prior to Visit  Medication Sig Dispense Refill  . ACAI BERRY PO Take 1,000 mg by mouth.    . ALPRAZolam (XANAX) 0.5 MG tablet     . anastrozole (ARIMIDEX) 1 MG tablet     . anastrozole (ARIMIDEX) 1 MG tablet Take 1 mg by mouth.    Marland Kitchen atorvastatin (LIPITOR) 10 MG tablet     . buPROPion (WELLBUTRIN XL) 150 MG 24 hr tablet     . calcitonin, salmon, (MIACALCIN/FORTICAL) 200 UNIT/ACT nasal spray     . CALCIUM CARBONATE PO Take by mouth.    . dicyclomine (BENTYL) 10 MG capsule     . furosemide (LASIX) 20 MG tablet     . Lactobacillus (PROBIOTIC ACIDOPHILUS PO) Take by mouth.    . meloxicam (MOBIC) 15 MG tablet 15 mg.    . Multiple Vitamin (MULTIVITAMIN) capsule Take by mouth.    . Multiple Vitamins-Minerals (CENTRUM SILVER PO) Take by mouth.    . naproxen (NAPROSYN) 500 MG tablet     . venlafaxine XR (EFFEXOR-XR) 37.5 MG 24 hr capsule Take 37.5 mg by mouth.    . zinc gluconate 50 MG tablet Take 50 mg by mouth.     No current facility-administered medications on file prior to visit.     Allergies  Allergen Reactions  . Ciprofloxacin Other (See Comments)     unknown    Objective:  General: Alert and oriented x3 in no acute distress  Dermatology: No open lesions bilateral lower extremities, no webspace macerations, no ecchymosis bilateral, all nails x 10 are well manicured.  Vascular: Dorsalis Pedis and Posterior Tibial pedal pulses palpable, Capillary Fill Time 3 seconds,(+) pedal hair growth bilateral, trace edema to ankle or right, temperature gradient within normal limits.  Neurology: Johney Maine sensation intact via light touch bilateral.  Musculoskeletal: Mild tenderness with palpation at diffusely at the medial and lateral ankle and top of foot on right, negative talar tilt, Negative tib-fib stress, No instability. No pain with calf compression bilateral. Range of motion within normal limits with mild guarding on right ankle. Strength within normal limits in all groups bilateral.   Gait: Antalgic gait  Xrays  Right ankle   Impression: No acute osseous findings, mild soft tissue swelling.  Assessment and Plan: Problem List Items Addressed This Visit    None    Visit Diagnoses    Right foot pain    -  Primary   Relevant Orders   DG Foot Complete Right   Sprain of right ankle, unspecified ligament, initial encounter       Edema of right ankle           -  Complete examination performed -Xrays reviewed -Discussed treatement options for likely ankle sprain -Applied Unna boot for patient to keep on for 5 days after 5 days to apply compression sleeve to assist with edema control at right ankle -Advised patient to rest her right ankle and use her cam boot -Encouraged ice and elevation and use of compression sleeve after she has removed her Unna boot -Continue with elderberry and anti-inflammatories as tolerated -Patient to return to office in 3 weeks or sooner if condition worsens.  Landis Martins, DPM

## 2018-11-02 ENCOUNTER — Telehealth: Payer: Self-pay | Admitting: Sports Medicine

## 2018-11-02 NOTE — Telephone Encounter (Signed)
Pt got a soft cast done and she needed to know if she needs to remove the cast and put on the boot. Please call patient

## 2018-11-02 NOTE — Telephone Encounter (Signed)
Left message informing pt the soft cast could be removed and she should go in to the cam boot.

## 2018-11-02 NOTE — Telephone Encounter (Signed)
I saw Dr. Cannon Kettle last week and I cannot remember if she said to take the light cast off today? Also, do I need to wear the boot the whole time?

## 2018-11-02 NOTE — Telephone Encounter (Signed)
Pt called and I reiterated the instructions left on her voice mail and she asked if she needed to go back in the compression sock and it told her to put it on 1st thing in the morning and then the cam boot.

## 2018-11-05 ENCOUNTER — Ambulatory Visit (INDEPENDENT_AMBULATORY_CARE_PROVIDER_SITE_OTHER): Payer: Medicare HMO | Admitting: Physical Medicine and Rehabilitation

## 2018-11-05 ENCOUNTER — Ambulatory Visit (INDEPENDENT_AMBULATORY_CARE_PROVIDER_SITE_OTHER): Payer: Self-pay

## 2018-11-05 ENCOUNTER — Encounter (INDEPENDENT_AMBULATORY_CARE_PROVIDER_SITE_OTHER): Payer: Self-pay | Admitting: Physical Medicine and Rehabilitation

## 2018-11-05 VITALS — BP 148/92 | HR 79 | Temp 98.6°F

## 2018-11-05 DIAGNOSIS — M5416 Radiculopathy, lumbar region: Secondary | ICD-10-CM | POA: Diagnosis not present

## 2018-11-05 MED ORDER — BETAMETHASONE SOD PHOS & ACET 6 (3-3) MG/ML IJ SUSP
12.0000 mg | Freq: Once | INTRAMUSCULAR | Status: AC
  Administered 2018-11-05: 12 mg

## 2018-11-05 NOTE — Progress Notes (Signed)
Vanessa Blackburn - 68 y.o. female MRN [id number]  Date of birth: 10-11-1951  Office Visit Note: Visit Date: 11/05/2018 PCP: Enid Skeens., MD Referred by: Enid Skeens., MD  Subjective: Chief Complaint  Patient presents with  . Lower Back - Pain  . Right Leg - Pain  . Left Leg - Pain   HPI:  Vanessa Blackburn is a 68 y.o. female who comes in today For planned repeat bilateral L5 transforaminal epidural steroid injection.  Patient was originally followed by Dr. Eduard Roux in our office.  She has lumbar listhesis of L5 on S1 with multifactorial stenosis of the canal and lateral recesses.  Injection in August gave her quite a bit of relief up until just recently she had no new trauma or red flag complaints.  Pain is across the lower back and down both legs.  We will repeat the injection today.  Depending on relief may need to consider surgical consultation.  Other avenues would be adjunct of medication treatment for nerve membrane stabilizing type medications and anticonvulsant and other nerve pain medications which may be hard with her medical history.  Brief exam shows good distal strength without clonus.  ROS Otherwise per HPI.  Assessment & Plan: Visit Diagnoses:  1. Lumbar radiculopathy     Plan: No additional findings.   Meds & Orders:  Meds ordered this encounter  Medications  . betamethasone acetate-betamethasone sodium phosphate (CELESTONE) injection 12 mg    Orders Placed This Encounter  Procedures  . XR C-ARM NO REPORT  . Epidural Steroid injection    Follow-up: Return if symptoms worsen or fail to improve.   Procedures: No procedures performed  Lumbosacral Transforaminal Epidural Steroid Injection - Sub-Pedicular Approach with Fluoroscopic Guidance  [name]      Date of Birth: April 03, 1951 MRN: [id number] PCP: Enid Skeens., MD      Visit Date: 11/05/2018   Universal Protocol:    Date/Time: 11/05/2018  Consent Given By: the  patient  Position: PRONE  Additional Comments: Vital signs were monitored before and after the procedure. Patient was prepped and draped in the usual sterile fashion. The correct patient, procedure, and site was verified.   Injection Procedure Details:  Procedure Site One Meds Administered:  Meds ordered this encounter  Medications  . betamethasone acetate-betamethasone sodium phosphate (CELESTONE) injection 12 mg    Laterality: Bilateral  Location/Site:  L5-S1  Needle size: 22 G  Needle type: Spinal  Needle Placement: Transforaminal  Findings:    -Comments: Excellent flow of contrast along the nerve and into the epidural space.  Procedure Details: After squaring off the end-plates to get a true AP view, the C-arm was positioned so that an oblique view of the foramen as noted above was visualized. The target area is just inferior to the "nose of the scotty dog" or sub pedicular. The soft tissues overlying this structure were infiltrated with 2-3 ml. of 1% Lidocaine without Epinephrine.  The spinal needle was inserted toward the target using a "trajectory" view along the fluoroscope beam.  Under AP and lateral visualization, the needle was advanced so it did not puncture dura and was located close the 6 O'Clock position of the pedical in AP tracterory. Biplanar projections were used to confirm position. Aspiration was confirmed to be negative for CSF and/or blood. A 1-2 ml. volume of Isovue-250 was injected and flow of contrast was noted at each level. Radiographs were obtained for documentation purposes.   After attaining  the desired flow of contrast documented above, a 0.5 to 1.0 ml test dose of 0.25% Marcaine was injected into each respective transforaminal space.  The patient was observed for 90 seconds post injection.  After no sensory deficits were reported, and normal lower extremity motor function was noted,   the above injectate was administered so that equal amounts of  the injectate were placed at each foramen (level) into the transforaminal epidural space.   Additional Comments:  The patient tolerated the procedure well Dressing: Band-Aid    Post-procedure details: Patient was observed during the procedure. Post-procedure instructions were reviewed.  Patient left the clinic in stable condition.     Clinical History: MRI LUMBAR SPINE WITHOUT CONTRAST  TECHNIQUE: Multiplanar, multisequence MR imaging of the lumbar spine was performed. No intravenous contrast was administered.  COMPARISON:  Radiography 05/08/2018.  CT abdomen 05/10/2015.  FINDINGS: Segmentation:  5 lumbar type vertebral bodies.  Alignment:  9 mm anterolisthesis L5-S1.  Vertebrae:  No fracture or primary bone lesion.  Conus medullaris and cauda equina: Conus extends to the L1 level. Conus and cauda equina appear normal.  Paraspinal and other soft tissues: Negative  Disc levels:  No significant finding at L3-4 or above. No canal or foraminal stenosis.  L4-5: Minimal noncompressive disc bulge. Facet osteoarthritis with mild edema which could contribute to back pain. No compressive stenosis.  L5-S1: Chronic bilateral facet arthropathy with 9 mm of anterolisthesis. Bulging of the disc. Canal and foraminal stenosis that could cause neural compression on either or both sides. No pars defect was seen on the CT scan of 2016, but it is possible that there could be acquired pars deficiency in this patient with chronic facet arthropathy and slippage.  IMPRESSION: L4-5: No stenosis. Facet osteoarthritis with mild edema that could be symptomatic.  L5-S1: Advanced bilateral facet arthropathy with 9 mm of anterolisthesis. Bulging of the disc. Stenosis of the canal and foramina that could cause neural compression on either or both sides and could certainly relate to low back pain. I think the primary process is that of facet arthropathy rather than primary  pars defect, but it is possible there could be an acquired pars fracture in addition to the facet arthropathy.   Electronically Signed   By: Nelson Chimes M.D.   On: 05/23/2018 16:55     Objective:  VS:  HT:    WT:   BMI:     BP:(!) 148/92  HR:79bpm  TEMP:98.6 F (37 C)(Oral)  RESP:  Physical Exam  Ortho Exam Imaging: Xr C-arm No Report  Result Date: 12/14/2018 Please see Notes tab for imaging impression.

## 2018-11-05 NOTE — Procedures (Signed)
Lumbosacral Transforaminal Epidural Steroid Injection - Sub-Pedicular Approach with Fluoroscopic Guidance  [name]      Date of Birth: [birth year] MRN: [id number] PCP: Enid Skeens., MD      Visit Date: 11/05/2018   Universal Protocol:    Date/Time: 11/05/2018  Consent Given By: the patient  Position: PRONE  Additional Comments: Vital signs were monitored before and after the procedure. Patient was prepped and draped in the usual sterile fashion. The correct patient, procedure, and site was verified.   Injection Procedure Details:  Procedure Site One Meds Administered:  Meds ordered this encounter  Medications  . betamethasone acetate-betamethasone sodium phosphate (CELESTONE) injection 12 mg    Laterality: Bilateral  Location/Site:  L5-S1  Needle size: 22 G  Needle type: Spinal  Needle Placement: Transforaminal  Findings:    -Comments: Excellent flow of contrast along the nerve and into the epidural space.  Procedure Details: After squaring off the end-plates to get a true AP view, the C-arm was positioned so that an oblique view of the foramen as noted above was visualized. The target area is just inferior to the "nose of the scotty dog" or sub pedicular. The soft tissues overlying this structure were infiltrated with 2-3 ml. of 1% Lidocaine without Epinephrine.  The spinal needle was inserted toward the target using a "trajectory" view along the fluoroscope beam.  Under AP and lateral visualization, the needle was advanced so it did not puncture dura and was located close the 6 O'Clock position of the pedical in AP tracterory. Biplanar projections were used to confirm position. Aspiration was confirmed to be negative for CSF and/or blood. A 1-2 ml. volume of Isovue-250 was injected and flow of contrast was noted at each level. Radiographs were obtained for documentation purposes.   After attaining the desired flow of contrast documented above, a  0.5 to 1.0 ml test dose of 0.25% Marcaine was injected into each respective transforaminal space.  The patient was observed for 90 seconds post injection.  After no sensory deficits were reported, and normal lower extremity motor function was noted,   the above injectate was administered so that equal amounts of the injectate were placed at each foramen (level) into the transforaminal epidural space.   Additional Comments:  The patient tolerated the procedure well Dressing: Band-Aid    Post-procedure details: Patient was observed during the procedure. Post-procedure instructions were reviewed.  Patient left the clinic in stable condition.

## 2018-11-05 NOTE — Progress Notes (Signed)
 .  Numeric Pain Rating Scale and Functional Assessment Average Pain 8   In the last MONTH (on 0-10 scale) has pain interfered with the following?  1. General activity like being  able to carry out your everyday physical activities such as walking, climbing stairs, carrying groceries, or moving a chair?  Rating(3)   +Driver, -BT, -Dye Allergies.  

## 2018-11-09 ENCOUNTER — Other Ambulatory Visit: Payer: Self-pay | Admitting: Sports Medicine

## 2018-11-09 DIAGNOSIS — S93401A Sprain of unspecified ligament of right ankle, initial encounter: Secondary | ICD-10-CM

## 2018-11-09 DIAGNOSIS — M79671 Pain in right foot: Secondary | ICD-10-CM

## 2018-11-18 ENCOUNTER — Ambulatory Visit: Payer: Medicare HMO | Admitting: Sports Medicine

## 2018-11-25 ENCOUNTER — Telehealth (INDEPENDENT_AMBULATORY_CARE_PROVIDER_SITE_OTHER): Payer: Self-pay | Admitting: Physical Medicine and Rehabilitation

## 2018-11-25 NOTE — Telephone Encounter (Signed)
Options include surgery eval - bad stenosis with lithesis, medication trial with OV to me, referral to pain managment

## 2018-11-25 NOTE — Telephone Encounter (Signed)
Scheduled for an OV to discuss. Patient states she does not want surgery unless absolutely necessary and she does not like taking pain medications.

## 2018-11-26 ENCOUNTER — Encounter: Payer: Self-pay | Admitting: Sports Medicine

## 2018-11-26 ENCOUNTER — Ambulatory Visit (INDEPENDENT_AMBULATORY_CARE_PROVIDER_SITE_OTHER): Payer: Medicare HMO | Admitting: Sports Medicine

## 2018-11-26 DIAGNOSIS — S93401D Sprain of unspecified ligament of right ankle, subsequent encounter: Secondary | ICD-10-CM

## 2018-11-26 DIAGNOSIS — M79671 Pain in right foot: Secondary | ICD-10-CM | POA: Diagnosis not present

## 2018-11-26 NOTE — Progress Notes (Signed)
Subjective: Vanessa Blackburn is a 68 y.o. female patient who returns to office for follow-up evaluation of right ankle pain.  Patient reports that pain is better not as constant sometimes she does occasionally with certain motion get shooting pain around her ankle that at most is 6 out of 10 but much better than her last visit.  Patient states that she feels like she is getting some motion in her boot and it is making the bottom of her heel hurt at first the boot seems to help give her relief but now seems of the boot is more aggravating.  Patient admits that she feels support with her ace wrap and feels good when she uses heat and ice.  Patient denies any reinjury, any warmth, redness, warmth or any other constitutional symptoms at this time. Patient Active Problem List   Diagnosis Date Noted  . Melanoma ([place]) 11/16/2014  . Malignant neoplasm of right breast ([place]) 07/05/2014  . Family history of malignant neoplasm of gastrointestinal tract 07/05/2014  . Family history of malignant neoplasm of breast 07/05/2014    Current Outpatient Medications on File Prior to Visit  Medication Sig Dispense Refill  . ACAI BERRY PO Take 1,000 mg by mouth.    . ALPRAZolam (XANAX) 0.5 MG tablet     . anastrozole (ARIMIDEX) 1 MG tablet     . anastrozole (ARIMIDEX) 1 MG tablet Take 1 mg by mouth.    Marland Kitchen atorvastatin (LIPITOR) 10 MG tablet     . buPROPion (WELLBUTRIN XL) 150 MG 24 hr tablet     . calcitonin, salmon, (MIACALCIN/FORTICAL) 200 UNIT/ACT nasal spray     . CALCIUM CARBONATE PO Take by mouth.    . dicyclomine (BENTYL) 10 MG capsule     . furosemide (LASIX) 20 MG tablet     . HYDROcodone-acetaminophen (NORCO/VICODIN) 5-325 MG tablet     . Lactobacillus (PROBIOTIC ACIDOPHILUS PO) Take by mouth.    . meloxicam (MOBIC) 15 MG tablet 15 mg.    . Multiple Vitamin (MULTIVITAMIN) capsule Take by mouth.    . Multiple Vitamins-Minerals (CENTRUM SILVER PO) Take by mouth.    . naproxen (NAPROSYN) 500 MG tablet     .  omeprazole (PRILOSEC) 20 MG capsule     . sulfamethoxazole-trimethoprim (BACTRIM DS,SEPTRA DS) 800-160 MG tablet     . venlafaxine XR (EFFEXOR-XR) 37.5 MG 24 hr capsule Take 37.5 mg by mouth.    . zinc gluconate 50 MG tablet Take 50 mg by mouth.     No current facility-administered medications on file prior to visit.     Allergies  Allergen Reactions  . Ciprofloxacin Other (See Comments)    unknown    Objective:  General: Alert and oriented x3 in no acute distress  Dermatology: No open lesions bilateral lower extremities, no webspace macerations, no ecchymosis bilateral, all nails x 10 are well manicured.  Vascular: Dorsalis Pedis and Posterior Tibial pedal pulses palpable, Capillary Fill Time 3 seconds,(+) pedal hair growth bilateral, trace edema to ankle or right, temperature gradient within normal limits.  Neurology: Johney Maine sensation intact via light touch bilateral.  Musculoskeletal: Much improved tenderness with palpation at diffusely at the medial and lateral ankle and top of foot on right, negative talar tilt, Negative tib-fib stress, No instability. No pain with calf compression bilateral. Range of motion within normal limits with mild guarding on right ankle. Strength within normal limits in all groups bilateral.    Assessment and Plan: Problem List Items Addressed This Visit  None    Visit Diagnoses    Sprain of right ankle, unspecified ligament, subsequent encounter    -  Primary   Right foot pain           -Complete examination performed -Discussed continued care for ankle sprain -Advised patient that she can discontinue the cam boot and may slowly start using Tri-Lock brace as dispensed this visit for at least 1 month after 1 month if her ankle feels good may slowly wean from the brace -Encouraged patient to continue with ice and elevation -Continue with elderberry and anti-inflammatories as tolerated -Patient to return to office as needed or if fails to  continue to improve or sooner if condition worsens.  Landis Martins, DPM

## 2018-12-03 ENCOUNTER — Encounter (INDEPENDENT_AMBULATORY_CARE_PROVIDER_SITE_OTHER): Payer: Self-pay | Admitting: Physical Medicine and Rehabilitation

## 2018-12-03 ENCOUNTER — Ambulatory Visit (INDEPENDENT_AMBULATORY_CARE_PROVIDER_SITE_OTHER): Payer: Medicare HMO | Admitting: Physical Medicine and Rehabilitation

## 2018-12-03 ENCOUNTER — Telehealth (INDEPENDENT_AMBULATORY_CARE_PROVIDER_SITE_OTHER): Payer: Self-pay | Admitting: Physical Medicine and Rehabilitation

## 2018-12-03 VITALS — BP 141/87 | HR 65 | Ht 60.0 in | Wt 150.0 lb

## 2018-12-03 DIAGNOSIS — M5416 Radiculopathy, lumbar region: Secondary | ICD-10-CM

## 2018-12-03 DIAGNOSIS — M48062 Spinal stenosis, lumbar region with neurogenic claudication: Secondary | ICD-10-CM | POA: Diagnosis not present

## 2018-12-03 DIAGNOSIS — M47816 Spondylosis without myelopathy or radiculopathy, lumbar region: Secondary | ICD-10-CM | POA: Diagnosis not present

## 2018-12-03 DIAGNOSIS — M4316 Spondylolisthesis, lumbar region: Secondary | ICD-10-CM

## 2018-12-03 NOTE — Progress Notes (Signed)
 .  Numeric Pain Rating Scale and Functional Assessment Average Pain 9.5 Pain Right Now 8 My pain is constant, stabbing and aching Pain is worse with: walking, standing and some activites Pain improves with: bending over   In the last MONTH (on 0-10 scale) has pain interfered with the following?  1. General activity like being  able to carry out your everyday physical activities such as walking, climbing stairs, carrying groceries, or moving a chair?  Rating(8)  2. Relation with others like being able to carry out your usual social activities and roles such as  activities at home, at work and in your community. Rating(8)  3. Enjoyment of life such that you have  been bothered by emotional problems such as feeling anxious, depressed or irritable?  Rating(5)

## 2018-12-04 ENCOUNTER — Encounter (INDEPENDENT_AMBULATORY_CARE_PROVIDER_SITE_OTHER): Payer: Self-pay | Admitting: Physical Medicine and Rehabilitation

## 2018-12-04 NOTE — Telephone Encounter (Signed)
Needs auth

## 2018-12-04 NOTE — Telephone Encounter (Signed)
Auth No / Request ID [id number] Status Auto-Approved Decision Approved Effective Date 12/04/2018 Expiration Date 01/18/2019

## 2018-12-04 NOTE — Progress Notes (Signed)
Vanessa Blackburn - 68 y.o. female MRN [id number]  Date of birth: 17-Jul-1951  Office Visit Note: Visit Date: 12/03/2018 PCP: Enid Skeens., MD Referred by: Enid Skeens., MD  Subjective: Chief Complaint  Patient presents with  . Lower Back - Pain  . Right Leg - Pain  . Left Leg - Pain   HPI: Vanessa Blackburn is a 68 y.o. female who comes in today For reevaluation management of severe low back and buttock pain which is now bilateral in both legs.  She had prior L5 transforaminal injections done last year with really excellent relief of her symptoms.  We recently repeated this injection except we really focused on the left side since the left seem to be bothering her more than the right and she really just did not get much relief from this a few weeks ago.  Now symptoms are bilateral she has had no new trauma since we have seen her no other change of symptoms.  She rates her pain as a 9 out of 10 on average and today is an 8 out of 10.  It is constant stabbing and aching worse with walking and standing.  MRI of the lumbar spine has been performed and is been reviewed with her and we reviewed it again today.  She has significant listhesis grade 1 at L5-S1 with fairly severe stenosis although I have seen worse.  This clearly seems to be the level causing her symptoms.  She has some remote history that she says of a fractured tailbone but obviously this is not seen on the MRI.  Injection was diagnostic at L5 at least on one occasion.  We did review the fluoroscopic images today and it just shows like maybe the injection last time was just not as well-placed as it could be.  She has not noted any focal weakness.  She is considering surgical intervention.  Review of Systems  Constitutional: Negative for chills, fever, malaise/fatigue and weight loss.  HENT: Negative for hearing loss and sinus pain.   Eyes: Negative for blurred vision, double vision and photophobia.  Respiratory: Negative for  cough and shortness of breath.   Cardiovascular: Negative for chest pain, palpitations and leg swelling.  Gastrointestinal: Negative for abdominal pain, nausea and vomiting.  Genitourinary: Negative for flank pain.  Musculoskeletal: Positive for back pain. Negative for myalgias.       Bilateral buttock and leg pain  Skin: Negative for itching and rash.  Neurological: Positive for tingling. Negative for tremors, focal weakness and weakness.  Endo/Heme/Allergies: Negative.   Psychiatric/Behavioral: Negative for depression.  All other systems reviewed and are negative.  Otherwise per HPI.  Assessment & Plan: Visit Diagnoses:  1. Lumbar radiculopathy   2. Spondylosis without myelopathy or radiculopathy, lumbar region   3. Spinal stenosis of lumbar region with neurogenic claudication   4. Spondylolisthesis of lumbar region     Plan: Findings:  Chronic worsening severe low back pain radiating into the bilateral buttocks and posterior lateral legs worse with standing and ambulating consistent with lumbar stenosis which she does have.  Some question of may be small dynamic listhesis reviewing images.  Last injection did not seem to help but it looks like it could be better placed.  Prior injection was excellent from a placement standpoint gave her a lot of relief.  I think the first step is to repeat that injection given the amount of pain that she is having and the fact that it gave her  almost 100% relief in the past.  If she does not get relief with that I would look at one more injection from an S1 from a transforaminal approach.  Were also going to put in a referral for Dr. Basil Dess to look at her from a surgical standpoint.    Meds & Orders: No orders of the defined types were placed in this encounter.   Orders Placed This Encounter  Procedures  . Ambulatory referral to Orthopedic Surgery    Follow-up: Return for Bilateral L5 transforaminal epidural steroid injection.    Procedures: No procedures performed  No notes on file   Clinical History: MRI LUMBAR SPINE WITHOUT CONTRAST  TECHNIQUE: Multiplanar, multisequence MR imaging of the lumbar spine was performed. No intravenous contrast was administered.  COMPARISON:  Radiography 05/08/2018.  CT abdomen 05/10/2015.  FINDINGS: Segmentation:  5 lumbar type vertebral bodies.  Alignment:  9 mm anterolisthesis L5-S1.  Vertebrae:  No fracture or primary bone lesion.  Conus medullaris and cauda equina: Conus extends to the L1 level. Conus and cauda equina appear normal.  Paraspinal and other soft tissues: Negative  Disc levels:  No significant finding at L3-4 or above. No canal or foraminal stenosis.  L4-5: Minimal noncompressive disc bulge. Facet osteoarthritis with mild edema which could contribute to back pain. No compressive stenosis.  L5-S1: Chronic bilateral facet arthropathy with 9 mm of anterolisthesis. Bulging of the disc. Canal and foraminal stenosis that could cause neural compression on either or both sides. No pars defect was seen on the CT scan of 2016, but it is possible that there could be acquired pars deficiency in this patient with chronic facet arthropathy and slippage.  IMPRESSION: L4-5: No stenosis. Facet osteoarthritis with mild edema that could be symptomatic.  L5-S1: Advanced bilateral facet arthropathy with 9 mm of anterolisthesis. Bulging of the disc. Stenosis of the canal and foramina that could cause neural compression on either or both sides and could certainly relate to low back pain. I think the primary process is that of facet arthropathy rather than primary pars defect, but it is possible there could be an acquired pars fracture in addition to the facet arthropathy.   Electronically Signed   By: Nelson Chimes M.D.   On: 05/23/2018 16:55   [name] No results for input(s):  HGBA1C, LABURIC in the last 8760 hours.  Objective:  VS:  HT:5' (152.4 cm)   WT:150 lb (68 kg)  BMI:29.3    BP:(!) 141/87  HR:65bpm  TEMP: ( )  RESP:  Physical Exam Vitals signs and nursing note reviewed.  Constitutional:      General: She is not in acute distress.    Appearance: Normal appearance. She is well-developed. She is not ill-appearing.  HENT:     Head: Normocephalic and atraumatic.  Eyes:     Conjunctiva/sclera: Conjunctivae normal.     Pupils: Pupils are equal, round, and reactive to light.  Cardiovascular:     Rate and Rhythm: Normal rate.     Pulses: Normal pulses.  Pulmonary:     Effort: Pulmonary effort is normal.  Musculoskeletal:     Right lower leg: No edema.     Left lower leg: No edema.     Comments: Patient goes from sit to stand without much difficulty she does have pain with extension and facet loading of the lumbar spine.  No pain with hip rotation she has good distal strength bilaterally and  symmetric with the good EHL dorsiflexion plantarflexion.  She has no clonus.  Skin:    General: Skin is warm and dry.     Findings: No erythema or rash.  Neurological:     General: No focal deficit present.     Mental Status: She is alert and oriented to person, place, and time.     Sensory: No sensory deficit.     Motor: No abnormal muscle tone.     Coordination: Coordination normal.     Gait: Gait abnormal.  Psychiatric:        Mood and Affect: Mood normal.        Behavior: Behavior normal.     Ortho Exam Imaging: No results found.  Past Medical/Family/Surgical/Social History: Medications & Allergies reviewed per EMR, new medications updated. Patient Active Problem List   Diagnosis Date Noted  . Melanoma ([place]) 11/16/2014  . Malignant neoplasm of right breast ([place]) 07/05/2014  . Family history of malignant neoplasm of gastrointestinal tract 07/05/2014  . Family history of malignant neoplasm of breast 07/05/2014   History reviewed. No pertinent  past medical history. Family History  Problem Relation Age of Onset  . Cancer Mother 41       female cancer - unknown type  . Cancer Father 74       throat cancer; +cig and ETOH  . Cancer Sister 95       breast  . Cancer Brother 24       colorectal  . Cancer Maternal Aunt        three maternal aunts with breast cancer at young ages  . Cancer Paternal Aunt        breast cancer at an unknown age  . Cancer Maternal Grandmother        breast cancer at a young age; stomach cancer at an older age  . Cancer Maternal Grandfather        brain cancer at an older age  . Cancer Paternal Grandmother        bone cancer at an older age  . Cancer Sister 71       melanoma on back   History reviewed. No pertinent surgical history. Social History   Occupational History  . Not on file  Tobacco Use  . Smoking status: Unknown If Ever Smoked  . Smokeless tobacco: Never Used  Substance and Sexual Activity  . Alcohol use: Not on file  . Drug use: Not on file  . Sexual activity: Not on file

## 2018-12-04 NOTE — Telephone Encounter (Signed)
dictated

## 2018-12-14 ENCOUNTER — Encounter (INDEPENDENT_AMBULATORY_CARE_PROVIDER_SITE_OTHER): Payer: Self-pay | Admitting: Physical Medicine and Rehabilitation

## 2018-12-14 ENCOUNTER — Ambulatory Visit (INDEPENDENT_AMBULATORY_CARE_PROVIDER_SITE_OTHER): Payer: Medicare HMO

## 2018-12-14 ENCOUNTER — Ambulatory Visit (INDEPENDENT_AMBULATORY_CARE_PROVIDER_SITE_OTHER): Payer: Medicare HMO | Admitting: Physical Medicine and Rehabilitation

## 2018-12-14 VITALS — BP 138/82 | HR 75 | Temp 98.9°F

## 2018-12-14 DIAGNOSIS — M4316 Spondylolisthesis, lumbar region: Secondary | ICD-10-CM

## 2018-12-14 DIAGNOSIS — M5416 Radiculopathy, lumbar region: Secondary | ICD-10-CM

## 2018-12-14 DIAGNOSIS — M48062 Spinal stenosis, lumbar region with neurogenic claudication: Secondary | ICD-10-CM

## 2018-12-14 MED ORDER — BETAMETHASONE SOD PHOS & ACET 6 (3-3) MG/ML IJ SUSP
12.0000 mg | Freq: Once | INTRAMUSCULAR | Status: AC
  Administered 2018-12-14: 12 mg

## 2018-12-14 NOTE — Progress Notes (Signed)
Pt states pain in lower back and radiates into both thighs and numbness in both legs all the way down. Pt states any movement makes pain worse and nothing helps. Pt states no major changes since last visit.   .Numeric Pain Rating Scale and Functional Assessment Average Pain 10   In the last MONTH (on 0-10 scale) has pain interfered with the following?  1. General activity like being  able to carry out your everyday physical activities such as walking, climbing stairs, carrying groceries, or moving a chair?  Rating(8)   +Driver, -BT, -Dye Allergies.

## 2018-12-15 NOTE — Progress Notes (Signed)
Vanessa Blackburn - 68 y.o. female MRN [id number]  Date of birth: [birth year]  Office Visit Note: Visit Date: 12/14/2018 PCP: Enid Skeens., MD Referred by: Enid Skeens., MD  Subjective: No chief complaint on file.  HPI:  Vanessa Blackburn is a 68 y.o. female who comes in today For planned bilateral L5 transforaminal epidural steroid injection.  Please see our prior evaluation and management note for further details and justification.  We also put a referral into Dr. Basil Dess for surgical consideration.  ROS Otherwise per HPI.  Assessment & Plan: Visit Diagnoses:  1. Lumbar radiculopathy   2. Spinal stenosis of lumbar region with neurogenic claudication   3. Spondylolisthesis of lumbar region     Plan: No additional findings.   Meds & Orders:  Meds ordered this encounter  Medications  . betamethasone acetate-betamethasone sodium phosphate (CELESTONE) injection 12 mg    Orders Placed This Encounter  Procedures  . XR C-ARM NO REPORT  . Epidural Steroid injection    Follow-up: Return if symptoms worsen or fail to improve.   Procedures: No procedures performed  Lumbosacral Transforaminal Epidural Steroid Injection - Sub-Pedicular Approach with Fluoroscopic Guidance  [name]      Date of Birth: [birth year] MRN: [id number] PCP: Enid Skeens., MD      Visit Date: 12/14/2018   Universal Protocol:    Date/Time: 12/14/2018  Consent Given By: the patient  Position: PRONE  Additional Comments: Vital signs were monitored before and after the procedure. Patient was prepped and draped in the usual sterile fashion. The correct patient, procedure, and site was verified.   Injection Procedure Details:  Procedure Site One Meds Administered:  Meds ordered this encounter  Medications  . betamethasone acetate-betamethasone sodium phosphate (CELESTONE) injection 12 mg    Laterality: Bilateral  Location/Site:  L5-S1  Needle size: 22 G  Needle  type: Spinal  Needle Placement: Transforaminal  Findings:    -Comments: Excellent flow of contrast along the nerve and into the epidural space.  Procedure Details: After squaring off the end-plates to get a true AP view, the C-arm was positioned so that an oblique view of the foramen as noted above was visualized. The target area is just inferior to the "nose of the scotty dog" or sub pedicular. The soft tissues overlying this structure were infiltrated with 2-3 ml. of 1% Lidocaine without Epinephrine.  The spinal needle was inserted toward the target using a "trajectory" view along the fluoroscope beam.  Under AP and lateral visualization, the needle was advanced so it did not puncture dura and was located close the 6 O'Clock position of the pedical in AP tracterory. Biplanar projections were used to confirm position. Aspiration was confirmed to be negative for CSF and/or blood. A 1-2 ml. volume of Isovue-250 was injected and flow of contrast was noted at each level. Radiographs were obtained for documentation purposes.   After attaining the desired flow of contrast documented above, a 0.5 to 1.0 ml test dose of 0.25% Marcaine was injected into each respective transforaminal space.  The patient was observed for 90 seconds post injection.  After no sensory deficits were reported, and normal lower extremity motor function was noted,   the above injectate was administered so that equal amounts of the injectate were placed at each foramen (level) into the transforaminal epidural space.   Additional Comments:  The patient tolerated the procedure well Dressing: 2 x 2 sterile gauze and Band-Aid    Post-procedure  details: Patient was observed during the procedure. Post-procedure instructions were reviewed.  Patient left the clinic in stable condition.    Clinical History: MRI LUMBAR SPINE WITHOUT CONTRAST  TECHNIQUE: Multiplanar, multisequence MR imaging of the lumbar spine was performed.  No intravenous contrast was administered.  COMPARISON:  Radiography 05/08/2018.  CT abdomen 05/10/2015.  FINDINGS: Segmentation:  5 lumbar type vertebral bodies.  Alignment:  9 mm anterolisthesis L5-S1.  Vertebrae:  No fracture or primary bone lesion.  Conus medullaris and cauda equina: Conus extends to the L1 level. Conus and cauda equina appear normal.  Paraspinal and other soft tissues: Negative  Disc levels:  No significant finding at L3-4 or above. No canal or foraminal stenosis.  L4-5: Minimal noncompressive disc bulge. Facet osteoarthritis with mild edema which could contribute to back pain. No compressive stenosis.  L5-S1: Chronic bilateral facet arthropathy with 9 mm of anterolisthesis. Bulging of the disc. Canal and foraminal stenosis that could cause neural compression on either or both sides. No pars defect was seen on the CT scan of 2016, but it is possible that there could be acquired pars deficiency in this patient with chronic facet arthropathy and slippage.  IMPRESSION: L4-5: No stenosis. Facet osteoarthritis with mild edema that could be symptomatic.  L5-S1: Advanced bilateral facet arthropathy with 9 mm of anterolisthesis. Bulging of the disc. Stenosis of the canal and foramina that could cause neural compression on either or both sides and could certainly relate to low back pain. I think the primary process is that of facet arthropathy rather than primary pars defect, but it is possible there could be an acquired pars fracture in addition to the facet arthropathy.   Electronically Signed   By: Nelson Chimes M.D.   On: 05/23/2018 16:55     Objective:  VS:  HT:    WT:   BMI:     BP:138/82  HR:75bpm  TEMP:98.9 F (37.2 C)(Oral)  RESP:  Physical Exam  Ortho Exam Imaging: Xr C-arm No Report  Result Date: 12/14/2018 Please see Notes tab for imaging impression.

## 2018-12-15 NOTE — Procedures (Signed)
Lumbosacral Transforaminal Epidural Steroid Injection - Sub-Pedicular Approach with Fluoroscopic Guidance  [name]      Date of Birth: 02-Sep-1951 MRN: [id number] PCP: Enid Skeens., MD      Visit Date: 12/14/2018   Universal Protocol:    Date/Time: 12/14/2018  Consent Given By: the patient  Position: PRONE  Additional Comments: Vital signs were monitored before and after the procedure. Patient was prepped and draped in the usual sterile fashion. The correct patient, procedure, and site was verified.   Injection Procedure Details:  Procedure Site One Meds Administered:  Meds ordered this encounter  Medications  . betamethasone acetate-betamethasone sodium phosphate (CELESTONE) injection 12 mg    Laterality: Bilateral  Location/Site:  L5-S1  Needle size: 22 G  Needle type: Spinal  Needle Placement: Transforaminal  Findings:    -Comments: Excellent flow of contrast along the nerve and into the epidural space.  Procedure Details: After squaring off the end-plates to get a true AP view, the C-arm was positioned so that an oblique view of the foramen as noted above was visualized. The target area is just inferior to the "nose of the scotty dog" or sub pedicular. The soft tissues overlying this structure were infiltrated with 2-3 ml. of 1% Lidocaine without Epinephrine.  The spinal needle was inserted toward the target using a "trajectory" view along the fluoroscope beam.  Under AP and lateral visualization, the needle was advanced so it did not puncture dura and was located close the 6 O'Clock position of the pedical in AP tracterory. Biplanar projections were used to confirm position. Aspiration was confirmed to be negative for CSF and/or blood. A 1-2 ml. volume of Isovue-250 was injected and flow of contrast was noted at each level. Radiographs were obtained for documentation purposes.   After attaining the desired flow of contrast documented above, a  0.5 to 1.0 ml test dose of 0.25% Marcaine was injected into each respective transforaminal space.  The patient was observed for 90 seconds post injection.  After no sensory deficits were reported, and normal lower extremity motor function was noted,   the above injectate was administered so that equal amounts of the injectate were placed at each foramen (level) into the transforaminal epidural space.   Additional Comments:  The patient tolerated the procedure well Dressing: 2 x 2 sterile gauze and Band-Aid    Post-procedure details: Patient was observed during the procedure. Post-procedure instructions were reviewed.  Patient left the clinic in stable condition.

## 2018-12-17 ENCOUNTER — Encounter (INDEPENDENT_AMBULATORY_CARE_PROVIDER_SITE_OTHER): Payer: Self-pay | Admitting: Specialist

## 2018-12-17 ENCOUNTER — Ambulatory Visit (INDEPENDENT_AMBULATORY_CARE_PROVIDER_SITE_OTHER): Payer: Medicare HMO

## 2018-12-17 ENCOUNTER — Ambulatory Visit (INDEPENDENT_AMBULATORY_CARE_PROVIDER_SITE_OTHER): Payer: Medicare HMO | Admitting: Specialist

## 2018-12-17 VITALS — BP 152/76 | HR 68 | Ht 60.0 in | Wt 150.0 lb

## 2018-12-17 DIAGNOSIS — M5136 Other intervertebral disc degeneration, lumbar region: Secondary | ICD-10-CM | POA: Diagnosis not present

## 2018-12-17 DIAGNOSIS — M5441 Lumbago with sciatica, right side: Secondary | ICD-10-CM

## 2018-12-17 DIAGNOSIS — M48062 Spinal stenosis, lumbar region with neurogenic claudication: Secondary | ICD-10-CM | POA: Diagnosis not present

## 2018-12-17 DIAGNOSIS — G8929 Other chronic pain: Secondary | ICD-10-CM

## 2018-12-17 DIAGNOSIS — M4316 Spondylolisthesis, lumbar region: Secondary | ICD-10-CM | POA: Diagnosis not present

## 2018-12-17 DIAGNOSIS — M5442 Lumbago with sciatica, left side: Secondary | ICD-10-CM

## 2018-12-17 NOTE — Progress Notes (Signed)
Office Visit Note   [name]           Date of Birth: 05-29-1951           MRN: [id number] Visit Date: 12/17/2018              Requested by: Magnus Sinning, MD [address] [place], [place] [address] PCP: Enid Skeens., MD   Assessment & Plan: Visit Diagnoses:  1. Chronic bilateral low back pain with bilateral sciatica   2. Spondylolisthesis, lumbar region   3. Spinal stenosis of lumbar region with neurogenic claudication   4. Degenerative disc disease, lumbar     Plan: Avoid bending, stooping and avoid lifting weights greater than 10 lbs. Avoid prolong standing and walking. Order for a new walker with wheels. Surgery scheduling secretary Kandice Hams, will call you in the next week to schedule for surgery.  Surgery recommended is a one level lumbar fusion L5-S1 this would be done with rods, screws and cages with local bone graft and allograft (donor bone graft). Take hydrocodone for for pain. Risk of surgery includes risk of infection 1 in 300 patients, bleeding 1/2% chance you would need a transfusion.   Risk to the nerves is one in 10,000. You will need to use a brace for 3 months and wean from the brace on the 4th month. Expect improved walking and standing tolerance. Expect relief of leg pain but numbness may persist depending on the length and degree of pressure that has been present.    Follow-Up Instructions: No follow-ups on file.   Orders:  Orders Placed This Encounter  Procedures  . XR Lumb Spine Flex&Ext Only   No orders of the defined types were placed in this encounter.     Procedures: No procedures performed   Clinical Data: Findings:  CLINICAL DATA:  Bilateral leg pain and weakness over the last several years.  EXAM: MRI LUMBAR SPINE WITHOUT CONTRAST  TECHNIQUE: Multiplanar, multisequence MR imaging of the lumbar spine was performed. No intravenous contrast was administered.  COMPARISON:  Radiography  05/08/2018.  CT abdomen 05/10/2015.  FINDINGS: Segmentation:  5 lumbar type vertebral bodies.  Alignment:  9 mm anterolisthesis L5-S1.  Vertebrae:  No fracture or primary bone lesion.  Conus medullaris and cauda equina: Conus extends to the L1 level. Conus and cauda equina appear normal.  Paraspinal and other soft tissues: Negative  Disc levels:  No significant finding at L3-4 or above. No canal or foraminal stenosis.  L4-5: Minimal noncompressive disc bulge. Facet osteoarthritis with mild edema which could contribute to back pain. No compressive stenosis.  L5-S1: Chronic bilateral facet arthropathy with 9 mm of anterolisthesis. Bulging of the disc. Canal and foraminal stenosis that could cause neural compression on either or both sides. No pars defect was seen on the CT scan of 2016, but it is possible that there could be acquired pars deficiency in this patient with chronic facet arthropathy and slippage.  IMPRESSION: L4-5: No stenosis. Facet osteoarthritis with mild edema that could be symptomatic.  L5-S1: Advanced bilateral facet arthropathy with 9 mm of anterolisthesis. Bulging of the disc. Stenosis of the canal and foramina that could cause neural compression on either or both sides and could certainly relate to low back pain. I think the primary process is that of facet arthropathy rather than primary pars defect, but it is possible there could be an acquired pars fracture in addition to the facet arthropathy.   Electronically Signed   By:  Nelson Chimes M.D.    Subjective: Chief Complaint  Patient presents with  . Lower Back - Pain, Follow-up    Referred by Dr. Ernestina Patches    68 year old female with a history of back pain since age 38. She report her legs will turn to noodles and she can fall. She went down in [employer] standing and she fell to the floor and then she got right up. She called her primary care MD the next day to ask for an evaluation.  The fall in  [place] was in 04/2018 and she saw Dr. Erlinda Hong in 04/2018 and then was referred to Dr. Ernestina Patches in 05/2018. Underwent ESIs for spondylolisthesis L5-S1 with excellent relief of pain into the buttocks and posterior thighs. Pain is worse with standing and walking sometimes it is present with lying in the bed and with turning in the bed. She does does keep grandson 29 year old. She leans on carts and holds on to them. She keeps leaning on the cart and moving her legs otherwise they get weak. She feels better quickly with shifting and sitting help. Some issues with bowel and bladder. If she needs to void or move her bowels she needs to be fast or she may have accidents of incontinence.    Review of Systems  Constitutional: Positive for unexpected weight change. Negative for activity change, appetite change, chills, diaphoresis, fatigue and fever.  HENT: Negative for congestion, dental problem, drooling, ear discharge, ear pain, facial swelling, hearing loss, mouth sores, nosebleeds, postnasal drip, rhinorrhea, sinus pressure, sinus pain, sneezing, sore throat, tinnitus, trouble swallowing and voice change.   Eyes: Negative.  Negative for photophobia, pain, discharge, redness, itching and visual disturbance.  Respiratory: Positive for shortness of breath and wheezing. Negative for apnea, cough, choking, chest tightness and stridor.   Cardiovascular: Negative.  Negative for chest pain, palpitations and leg swelling.  Gastrointestinal: Negative.  Negative for abdominal distention, abdominal pain, anal bleeding, blood in stool, constipation, diarrhea and nausea.  Endocrine: Negative.   Genitourinary: Negative.  Negative for difficulty urinating, dyspareunia, dysuria, enuresis, flank pain, frequency, genital sores, hematuria, menstrual problem, pelvic pain and urgency.  Musculoskeletal: Positive for back pain and gait problem. Negative for arthralgias, joint swelling, myalgias, neck pain and neck stiffness.    Skin: Negative.  Negative for color change, pallor, rash and wound.  Allergic/Immunologic: Negative.  Negative for environmental allergies, food allergies and immunocompromised state.  Neurological: Positive for weakness and numbness. Negative for dizziness, tremors, seizures, syncope, facial asymmetry, speech difficulty, light-headedness and headaches.  Hematological: Negative.  Negative for adenopathy. Does not bruise/bleed easily.  Psychiatric/Behavioral: Negative.  Negative for agitation, behavioral problems, confusion, decreased concentration, dysphoric mood, hallucinations, self-injury, sleep disturbance and suicidal ideas. The patient is not nervous/anxious and is not hyperactive.      Objective: Vital Signs: BP (!) 152/76 (BP Location: Left Arm, Patient Position: Sitting)   Pulse 68   Ht 5' (1.524 m)   Wt 150 lb (68 kg)   BMI 29.29 kg/m   Physical Exam Constitutional:      Appearance: She is well-developed.  HENT:     Head: Normocephalic and atraumatic.  Eyes:     Pupils: Pupils are equal, round, and reactive to light.  Neck:     Musculoskeletal: Normal range of motion and neck supple.  Pulmonary:     Effort: Pulmonary effort is normal.     Breath sounds: Normal breath sounds.  Abdominal:     General: Bowel sounds are  normal.     Palpations: Abdomen is soft.  Musculoskeletal: Normal range of motion.  Skin:    General: Skin is warm and dry.  Neurological:     Mental Status: She is alert and oriented to person, place, and time.  Psychiatric:        Behavior: Behavior normal.        Thought Content: Thought content normal.        Judgment: Judgment normal.     Ortho Exam  Specialty Comments:  No specialty comments available.  Imaging: Xr Lumb Spine Flex&ext Only  Result Date: 12/17/2018 AP and Lateral flexion and extension radiographs show grade 2 anterolisthesis L5-S1. Remaining levels are normal with normal disc height at all but L5-S1 where there is  moderately severe degenerative disc narrowing.     PMFS History: Patient Active Problem List   Diagnosis Date Noted  . Melanoma ([facility]) 11/16/2014  . Malignant neoplasm of right breast ([facility]) 07/05/2014  . Family history of malignant neoplasm of gastrointestinal tract 07/05/2014  . Family history of malignant neoplasm of breast 07/05/2014   History reviewed. No pertinent past medical history.  Family History  Problem Relation Age of Onset  . Cancer Mother 62       female cancer - unknown type  . Cancer Father 73       throat cancer; +cig and ETOH  . Cancer Sister 28       breast  . Cancer Brother 21       colorectal  . Cancer Maternal Aunt        three maternal aunts with breast cancer at young ages  . Cancer Paternal Aunt        breast cancer at an unknown age  . Cancer Maternal Grandmother        breast cancer at a young age; stomach cancer at an older age  . Cancer Maternal Grandfather        brain cancer at an older age  . Cancer Paternal Grandmother        bone cancer at an older age  . Cancer Sister 76       melanoma on back    History reviewed. No pertinent surgical history. Social History   Occupational History  . Not on file  Tobacco Use  . Smoking status: Unknown If Ever Smoked  . Smokeless tobacco: Never Used  Substance and Sexual Activity  . Alcohol use: Not on file  . Drug use: Not on file  . Sexual activity: Not on file

## 2018-12-17 NOTE — Patient Instructions (Signed)
Plan: Avoid bending, stooping and avoid lifting weights greater than 10 lbs. Avoid prolong standing and walking. Order for a new walker with wheels. Surgery scheduling secretary Kandice Hams, will call you in the next week to schedule for surgery.  Surgery recommended is a one level lumbar fusion L5-S1 this would be done with rods, screws and cages with local bone graft and allograft (donor bone graft). Take hydrocodone for for pain. Risk of surgery includes risk of infection 1 in 300 patients, bleeding 1/2% chance you would need a transfusion.   Risk to the nerves is one in 10,000. You will need to use a brace for 3 months and wean from the brace on the 4th month. Expect improved walking and standing tolerance. Expect relief of leg pain but numbness may persist depending on the length and degree of pressure that has been present.

## 2019-01-01 ENCOUNTER — Telehealth (INDEPENDENT_AMBULATORY_CARE_PROVIDER_SITE_OTHER): Payer: Self-pay | Admitting: *Deleted

## 2019-01-04 NOTE — Telephone Encounter (Signed)
Spoke with pt per Dr. Ernestina Patches and she states injections does not work. Pt states she would just like to have pain medication at night for her to be able to sleep. Pt does not want to scheduled repeat injection at this time.

## 2019-01-04 NOTE — Telephone Encounter (Signed)
Called pt and lvm advising pt to call back.

## 2019-01-04 NOTE — Telephone Encounter (Signed)
Can only do 1 to 2 injections every 3 to 4 months.

## 2019-01-05 NOTE — Telephone Encounter (Signed)
Patient has f/u appt on 02/11/2019 with Dr. Louanne Skye and we will discuss it further at that time.

## 2019-02-11 ENCOUNTER — Other Ambulatory Visit: Payer: Self-pay

## 2019-02-11 ENCOUNTER — Encounter (INDEPENDENT_AMBULATORY_CARE_PROVIDER_SITE_OTHER): Payer: Self-pay | Admitting: Specialist

## 2019-02-11 ENCOUNTER — Ambulatory Visit (INDEPENDENT_AMBULATORY_CARE_PROVIDER_SITE_OTHER): Payer: Medicare HMO | Admitting: Specialist

## 2019-02-11 VITALS — BP 120/75 | HR 72 | Ht 60.0 in | Wt 150.0 lb

## 2019-02-11 DIAGNOSIS — M4316 Spondylolisthesis, lumbar region: Secondary | ICD-10-CM | POA: Diagnosis not present

## 2019-02-11 DIAGNOSIS — M48062 Spinal stenosis, lumbar region with neurogenic claudication: Secondary | ICD-10-CM

## 2019-02-11 NOTE — Patient Instructions (Addendum)
Avoid bending, stooping and avoid lifting weights greater than 10 lbs. Avoid prolong standing and walking. Avoid frequent bending and stooping  No lifting greater than 10 lbs. May use ice or moist heat for pain. Weight loss is of benefit. Handicap license is approved. Will refer to pain management for evaluation and treatment of L5-S1 spondylolisthesis and spinal stenosis.  Call primary care MD and request a sleep aid. I am reluctant to prescribe due to use of xanax and other tricyclic antidepressants. She has tried benadryl without help. I am going to refer you to a pain management program as you are not at a point where you would want surgical treatment.  Referral to Dr. Hardin Negus for pain associated with spondylolisthesis.

## 2019-02-11 NOTE — Progress Notes (Signed)
Office Visit Note   [name]           Date of Birth: 02-17-1951           MRN: [id number] Visit Date: 02/11/2019              Requested by: Enid Skeens., MD [address], [place] [address] PCP: Enid Skeens., MD   Assessment & Plan: Visit Diagnoses:  1. Spinal stenosis of lumbar region with neurogenic claudication   2. Spondylolisthesis, lumbar region     Plan: Avoid bending, stooping and avoid lifting weights greater than 10 lbs. Avoid prolong standing and walking. Avoid frequent bending and stooping  No lifting greater than 10 lbs. May use ice or moist heat for pain. Weight loss is of benefit. Handicap license is approved. Will refer to pain management for evaluation and treatment of L5-S1 spondylolisthesis and spinal stenosis.  Call primary care MD and request a sleep aid. I am reluctant to prescribe due to use of xanax and other tricyclic antidepressants. She has tried benadryl without help. I am going to refer you to a pain management program as you are not at a point where you would want surgical treatment.  Referral to Dr. Hardin Negus for pain associated with spondylolisthesis.   Follow-Up Instructions: No follow-ups on file.   Orders:  No orders of the defined types were placed in this encounter.  No orders of the defined types were placed in this encounter.     Procedures: No procedures performed   Clinical Data: Findings:  9MM Spondylolisthesis L5-S1 with bilateral foramenal stenosis affecting the bilateral L5 nerves     Subjective: Chief Complaint  Patient presents with  . Lower Back - Follow-up    68 year old female with history of spondylolisthesis, she has been experiencing weakness in her legs and tendency for the legs to give away. She has been seen in 11/2018 and is on the list for surgical treatment. Has undergone ESIs x 3 with only a short period of relief. Plans are for single level fusion with cage and pedicle  screws and rod. She has trouble sleeping on her back and tends to sleep on her side. She keeps several pillows.She has numbness in the buttocks and radiates downwards and feels like it is being ripped apart.  No bowel or bladder difficulties. She is constipated lately. Was prescribed hydrocodone and naprosyn but is not taking. "I just tough it out."   Review of Systems  Constitutional: Negative for activity change, appetite change, chills, diaphoresis, fatigue, fever and unexpected weight change.  HENT: Positive for hearing loss. Negative for congestion, dental problem, drooling, ear discharge, ear pain, facial swelling, mouth sores, nosebleeds, postnasal drip, rhinorrhea, sinus pressure, sinus pain, sneezing, sore throat, tinnitus, trouble swallowing and voice change.   Eyes: Positive for visual disturbance. Negative for photophobia, pain, discharge, redness and itching.  Respiratory: Positive for shortness of breath. Negative for apnea, cough, choking, chest tightness, wheezing and stridor.   Cardiovascular: Negative for chest pain, palpitations and leg swelling.  Gastrointestinal: Positive for nausea. Negative for abdominal distention, abdominal pain, anal bleeding, blood in stool, constipation, diarrhea, rectal pain and vomiting.  Endocrine: Negative for cold intolerance, heat intolerance, polydipsia, polyphagia and polyuria.  Genitourinary: Negative for difficulty urinating, dyspareunia, dysuria, enuresis, frequency, hematuria and urgency.  Musculoskeletal: Positive for back pain and gait problem. Negative for arthralgias, joint swelling, myalgias, neck pain and neck stiffness.  Skin: Negative for color change, pallor,  rash and wound.  Allergic/Immunologic: Negative for environmental allergies, food allergies and immunocompromised state.  Neurological: Positive for weakness and numbness. Negative for dizziness, tremors, seizures, syncope, facial asymmetry, speech difficulty, light-headedness and  headaches.  Hematological: Negative for adenopathy. Does not bruise/bleed easily.  Psychiatric/Behavioral: Negative for agitation, behavioral problems, confusion, decreased concentration, dysphoric mood, hallucinations, self-injury, sleep disturbance and suicidal ideas. The patient is not nervous/anxious and is not hyperactive.      Objective: Vital Signs: BP 120/75 (BP Location: Left Arm, Patient Position: Sitting)   Pulse 72   Ht 5' (1.524 m)   Wt 150 lb (68 kg)   BMI 29.29 kg/m   Physical Exam Constitutional:      Appearance: She is well-developed.  HENT:     Head: Normocephalic and atraumatic.  Eyes:     Pupils: Pupils are equal, round, and reactive to light.  Neck:     Musculoskeletal: Normal range of motion and neck supple.  Pulmonary:     Effort: Pulmonary effort is normal.     Breath sounds: Normal breath sounds.  Abdominal:     General: Bowel sounds are normal.     Palpations: Abdomen is soft.  Skin:    General: Skin is warm and dry.  Neurological:     Mental Status: She is alert and oriented to person, place, and time.  Psychiatric:        Behavior: Behavior normal.        Thought Content: Thought content normal.        Judgment: Judgment normal.     Back Exam   Tenderness  The patient is experiencing tenderness in the lumbar.  Range of Motion  Extension:  0 abnormal  Flexion:  70 abnormal  Lateral bend right: abnormal  Lateral bend left: abnormal  Rotation right: abnormal  Rotation left: abnormal   Muscle Strength  Right Quadriceps:  5/5  Left Quadriceps:  5/5  Right Hamstrings:  5/5  Left Hamstrings:  5/5   Tests  Straight leg raise right: negative Straight leg raise left: negative  Reflexes  Patellar: 3/4 Achilles: 1/4 Babinski's sign: normal   Other  Toe walk: normal Heel walk: normal Sensation: normal Gait: abnormal  Erythema: no back redness Scars: absent      Specialty Comments:  No specialty comments available.   Imaging: No results found.   PMFS History: Patient Active Problem List   Diagnosis Date Noted  . Melanoma ([facility]) 11/16/2014  . Malignant neoplasm of right breast ([facility]) 07/05/2014  . Family history of malignant neoplasm of gastrointestinal tract 07/05/2014  . Family history of malignant neoplasm of breast 07/05/2014   History reviewed. No pertinent past medical history.  Family History  Problem Relation Age of Onset  . Cancer Mother 80       female cancer - unknown type  . Cancer Father 20       throat cancer; +cig and ETOH  . Cancer Sister 62       breast  . Cancer Brother 38       colorectal  . Cancer Maternal Aunt        three maternal aunts with breast cancer at young ages  . Cancer Paternal Aunt        breast cancer at an unknown age  . Cancer Maternal Grandmother        breast cancer at a young age; stomach cancer at an older age  . Cancer Maternal Grandfather  brain cancer at an older age  . Cancer Paternal Grandmother        bone cancer at an older age  . Cancer Sister 41       melanoma on back    History reviewed. No pertinent surgical history. Social History   Occupational History  . Not on file  Tobacco Use  . Smoking status: Unknown If Ever Smoked  . Smokeless tobacco: Never Used  Substance and Sexual Activity  . Alcohol use: Not on file  . Drug use: Not on file  . Sexual activity: Not on file

## 2019-02-11 NOTE — Addendum Note (Signed)
Addended by: Minda Ditto, Alyse Low N on: 02/11/2019 10:04 AM   Modules accepted: Orders

## 2019-03-10 DIAGNOSIS — C50911 Malignant neoplasm of unspecified site of right female breast: Secondary | ICD-10-CM | POA: Diagnosis not present

## 2019-03-10 DIAGNOSIS — Z170 Estrogen receptor positive status [ER+]: Secondary | ICD-10-CM | POA: Diagnosis not present

## 2019-05-10 ENCOUNTER — Encounter: Payer: Self-pay | Admitting: Specialist

## 2019-05-13 ENCOUNTER — Ambulatory Visit: Payer: Self-pay | Admitting: Specialist

## 2019-08-25 DIAGNOSIS — Z79811 Long term (current) use of aromatase inhibitors: Secondary | ICD-10-CM

## 2019-08-25 DIAGNOSIS — C50911 Malignant neoplasm of unspecified site of right female breast: Secondary | ICD-10-CM | POA: Diagnosis not present

## 2019-08-25 DIAGNOSIS — Z170 Estrogen receptor positive status [ER+]: Secondary | ICD-10-CM | POA: Diagnosis not present

## 2019-11-25 ENCOUNTER — Ambulatory Visit (INDEPENDENT_AMBULATORY_CARE_PROVIDER_SITE_OTHER): Payer: Medicare HMO | Admitting: Cardiology

## 2019-11-25 ENCOUNTER — Other Ambulatory Visit: Payer: Self-pay

## 2019-11-25 ENCOUNTER — Encounter: Payer: Self-pay | Admitting: Cardiology

## 2019-11-25 DIAGNOSIS — Z853 Personal history of malignant neoplasm of breast: Secondary | ICD-10-CM | POA: Insufficient documentation

## 2019-11-25 DIAGNOSIS — Z01810 Encounter for preprocedural cardiovascular examination: Secondary | ICD-10-CM | POA: Diagnosis not present

## 2019-11-25 DIAGNOSIS — Z87891 Personal history of nicotine dependence: Secondary | ICD-10-CM | POA: Insufficient documentation

## 2019-11-25 DIAGNOSIS — R011 Cardiac murmur, unspecified: Secondary | ICD-10-CM | POA: Diagnosis not present

## 2019-11-25 DIAGNOSIS — E782 Mixed hyperlipidemia: Secondary | ICD-10-CM | POA: Insufficient documentation

## 2019-11-25 NOTE — Progress Notes (Signed)
Cardiology Office Note:    Date:  11/25/2019   ID:  Vanessa Blackburn, DOB May 20, 1951, MRN [id number]  PCP:  Enid Skeens., MD  Cardiologist:  Jenean Lindau, MD   Referring MD: Enid Skeens., MD    ASSESSMENT:    1. Preop cardiovascular exam   2. Cardiac murmur   3. History of smoking   4. History of breast cancer   5. Mixed dyslipidemia    PLAN:    In order of problems listed above:  1. Primary prevention stressed to the patient.  Importance of compliance with diet and medication stressed and she vocalized understanding. 2. Mixed dyslipidemia: Diet was discussed for dyslipidemia and lipids are followed by primary care physician. 3. Cardiac murmur: Echocardiogram will be done to assess murmur heard on auscultation. 4. Preoperative or stratification: In view of multiple risk factors for coronary artery disease she will undergo Lexiscan sestamibi.  If this is negative then she is not at high risk for coronary events during the aforementioned surgery.  Meticulous hemodynamic monitoring will further reduce the risk of coronary events. 5. Ex-smoker: Promises never to go back to smoking. 6. Patient will be seen in follow-up appointment in 6 months or earlier if the patient has any concerns 7.    Medication Adjustments/Labs and Tests Ordered: Current medicines are reviewed at length with the patient today.  Concerns regarding medicines are outlined above.  No orders of the defined types were placed in this encounter.  No orders of the defined types were placed in this encounter.    History of Present Illness:    Vanessa Blackburn is a 69 y.o. female who is being seen today for the evaluation of preoperative cardiovascular evaluation at the request of [place], Vanessa Blackburn., MD.  Patient is a pleasant 69 year old female.  She has past medical history of mixed dyslipidemia and breast cancer.  She tells me that she has completed treatment for this.  She has  issues with her back and is contemplating surgery and is here for preop assessment.  She is an ex-smoker.  She denies any chest pain orthopnea or PND.  She leads overall a sedentary lifestyle because of orthopedic issues.  She is here for evaluation for the same.  At the time of my evaluation, the patient is alert awake oriented and in no distress.  History reviewed. No pertinent past medical history.  History reviewed. No pertinent surgical history.  Current Medications: Current Meds  Medication Sig  . ACAI BERRY PO Take 1,000 mg by mouth.  . ALPRAZolam (XANAX) 0.5 MG tablet   . anastrozole (ARIMIDEX) 1 MG tablet Take 1 mg by mouth.  Marland Kitchen atorvastatin (LIPITOR) 10 MG tablet   . buPROPion (WELLBUTRIN XL) 150 MG 24 hr tablet   . calcitonin, salmon, (MIACALCIN/FORTICAL) 200 UNIT/ACT nasal spray   . CALCIUM CARBONATE PO Take by mouth.  . Lactobacillus (PROBIOTIC ACIDOPHILUS PO) Take by mouth.  . Multiple Vitamin (MULTIVITAMIN) capsule Take by mouth.  . Multiple Vitamins-Minerals (CENTRUM SILVER PO) Take by mouth.  Marland Kitchen omeprazole (PRILOSEC) 20 MG capsule   . venlafaxine XR (EFFEXOR-XR) 37.5 MG 24 hr capsule Take 37.5 mg by mouth.     Allergies:   Ciprofloxacin   Social History   Socioeconomic History  . Marital status: Divorced    Spouse name: Not on file  . Number of children: Not on file  . Years of education: Not on file  . Highest education level: Not on file  Occupational History  . Not on file  Tobacco Use  . Smoking status: Unknown If Ever Smoked  . Smokeless tobacco: Never Used  Substance and Sexual Activity  . Alcohol use: Not on file  . Drug use: Not on file  . Sexual activity: Not on file  Other Topics Concern  . Not on file  Social History Narrative  . Not on file   Social Determinants of Health   Financial Resource Strain:   . Difficulty of Paying Living Expenses: Not on file  Food Insecurity:   . Worried About Charity fundraiser in the Last Year: Not on file    . Ran Out of Food in the Last Year: Not on file  Transportation Needs:   . Lack of Transportation (Medical): Not on file  . Lack of Transportation (Non-Medical): Not on file  Physical Activity:   . Days of Exercise per Week: Not on file  . Minutes of Exercise per Session: Not on file  Stress:   . Feeling of Stress : Not on file  Social Connections:   . Frequency of Communication with Friends and Family: Not on file  . Frequency of Social Gatherings with Friends and Family: Not on file  . Attends Religious Services: Not on file  . Active Member of Clubs or Organizations: Not on file  . Attends Archivist Meetings: Not on file  . Marital Status: Not on file     Family History: The patient's family history includes Cancer in her maternal aunt, maternal grandfather, maternal grandmother, paternal aunt, and paternal grandmother; Cancer (age of onset: 79) in her brother; Cancer (age of onset: 59) in her sister; Cancer (age of onset: 74) in her sister; Cancer (age of onset: 62) in her father; Cancer (age of onset: 18) in her mother.  ROS:   Please see the history of present illness.    All other systems reviewed and are negative.  EKGs/Labs/Other Studies Reviewed:    The following studies were reviewed today: EKG reveals sinus rhythm and nonspecific ST-T changes   Recent Labs: No results found for requested labs within last 8760 hours.  Recent Lipid Panel No results found for: CHOL, TRIG, HDL, CHOLHDL, VLDL, LDLCALC, LDLDIRECT  Physical Exam:    VS:  BP 120/70   Pulse 78   Ht 5' (1.524 m)   Wt 148 lb (67.1 kg)   SpO2 98%   BMI 28.90 kg/m     Wt Readings from Last 3 Encounters:  11/25/19 148 lb (67.1 kg)  02/11/19 150 lb (68 kg)  12/17/18 150 lb (68 kg)     GEN: Patient is in no acute distress HEENT: Normal NECK: No JVD; No carotid bruits LYMPHATICS: No lymphadenopathy CARDIAC: S1 S2 regular, 2/6 systolic murmur at the apex. RESPIRATORY:  Clear to  auscultation without rales, wheezing or rhonchi  ABDOMEN: Soft, non-tender, non-distended MUSCULOSKELETAL:  No edema; No deformity  SKIN: Warm and dry NEUROLOGIC:  Alert and oriented x 3 PSYCHIATRIC:  Normal affect    Signed, Jenean Lindau, MD  11/25/2019 3:14 PM    [facility]

## 2019-11-25 NOTE — Patient Instructions (Signed)
Medication Instructions:  No medication changes *If you need a refill on your cardiac medications before your next appointment, please call your pharmacy*  Lab Work: You had a BMET today in the office. If you have labs (blood work) drawn today and your tests are completely normal, you will receive your results only by: Marland Kitchen MyChart Message (if you have MyChart) OR . A paper copy in the mail If you have any lab test that is abnormal or we need to change your treatment, we will call you to review the results.  Testing/Procedures: Your physician has requested that you have a lexiscan myoview. For further information please visit [email or web address]. Please follow instruction sheet, as given.  Nothing to eat or drink 3 hours prior to your test. NO caffeine products or decaffeinated beverages or choclate 12 hours prior to your test.  Wear comfortable clothes (no overalls or dresses) and comfortable shoes (no open toed or heels.)       Follow-Up: At [facility], you and your health needs are our priority.  As part of our continuing mission to provide you with exceptional heart care, we have created designated Provider Care Teams.  These Care Teams include your primary Cardiologist (physician) and Advanced Practice Providers (APPs -  Physician Assistants and Nurse Practitioners) who all work together to provide you with the care you need, when you need it.  Your next appointment:   6 month(s)  The format for your next appointment:   In Person  Provider:   Jyl Heinz, MD  Other Instructions

## 2019-11-25 NOTE — Addendum Note (Signed)
Addended by: Truddie Hidden on: 11/25/2019 03:48 PM   Modules accepted: Orders

## 2019-11-26 LAB — BASIC METABOLIC PANEL
BUN/Creatinine Ratio: 15 (ref 12–28)
BUN: 11 mg/dL (ref 8–27)
CO2: 26 mmol/L (ref 20–29)
Calcium: 10.6 mg/dL — ABNORMAL HIGH (ref 8.7–10.3)
Chloride: 101 mmol/L (ref 96–106)
Creatinine, Ser: 0.75 mg/dL (ref 0.57–1.00)
GFR calc Af Amer: 96 mL/min/{1.73_m2} (ref >59)
GFR calc non Af Amer: 83 mL/min/{1.73_m2} (ref >59)
Glucose: 120 mg/dL — ABNORMAL HIGH (ref 65–99)
Potassium: 3.7 mmol/L (ref 3.5–5.2)
Sodium: 140 mmol/L (ref 134–144)

## 2019-11-29 ENCOUNTER — Telehealth (HOSPITAL_COMMUNITY): Payer: Self-pay | Admitting: *Deleted

## 2019-11-29 NOTE — Telephone Encounter (Signed)
Patient is returning call regarding instructions for Myocardial Perfusion scheduled for tomorrow, 11/30/19. Please call to discuss.

## 2019-11-29 NOTE — Telephone Encounter (Signed)
Returned patient's call. She states she rescheduled her stress test to 3/4. She has no questions at this time.

## 2019-11-29 NOTE — Telephone Encounter (Signed)
Left message on voicemail per DPR in reference to upcoming appointment scheduled on 11/30/19  with detailed instructions given per Myocardial Perfusion Study Information Sheet for the test. LM to arrive 15 minutes early, and that it is imperative to arrive on time for appointment to keep from having the test rescheduled. If you need to cancel or reschedule your appointment, please call the office within 24 hours of your appointment. Failure to do so may result in a cancellation of your appointment, and a $50 no show fee. Phone number given for call back for any questions. Kirstie Peri

## 2019-12-15 ENCOUNTER — Telehealth (HOSPITAL_COMMUNITY): Payer: Self-pay | Admitting: *Deleted

## 2019-12-15 ENCOUNTER — Encounter (HOSPITAL_COMMUNITY): Payer: Self-pay | Admitting: *Deleted

## 2019-12-15 NOTE — Telephone Encounter (Signed)
Attempted to call patient regarding upcoming Nuclear appointment- no answer- My Chart Letter sent with instructions.  Vanessa Blackburn

## 2019-12-20 ENCOUNTER — Telehealth: Payer: Self-pay | Admitting: Cardiology

## 2019-12-20 NOTE — Telephone Encounter (Signed)
   Pt wanted to know if its ok to go to her CT on 12/23/2019. On the same day she has appt with her skin doctor. They will give her local anesthesia shot on her left leg and she wanted to know if that shot won't react to the meds she will get on her CT.  Please advise

## 2019-12-21 NOTE — Telephone Encounter (Signed)
LMTCB

## 2019-12-21 NOTE — Telephone Encounter (Signed)
Spoke with the pt and she also talked with the "skin" doctor but as far as she knows she will only be getting Lidocaine injection the same morning of her her Nuc Med Stress Test... . I advised her should be fine but will forward to [place] to be sure.

## 2019-12-22 ENCOUNTER — Telehealth: Payer: Self-pay | Admitting: Cardiology

## 2019-12-22 NOTE — Telephone Encounter (Signed)
New Message   Gregary Signs from [facility] is returning a call   Please call back

## 2019-12-22 NOTE — Telephone Encounter (Signed)
I do not see any issues.

## 2019-12-22 NOTE — Telephone Encounter (Signed)
Pt is scheduled for 12/23/2019 and per the staff of the nuclear department there will not be a problem with the lidocaine.

## 2019-12-22 NOTE — Telephone Encounter (Signed)
Left a detailed message for [facility] Nuclear med regarding the lidocaine injection on 12/24/19.

## 2019-12-22 NOTE — Telephone Encounter (Signed)
Please call Gregary Signs or Morey Hummingbird at  [facility] at [phone] . This number is a phone that the nurses carry on them and can be reached at .

## 2019-12-23 ENCOUNTER — Other Ambulatory Visit: Payer: Self-pay

## 2019-12-23 ENCOUNTER — Ambulatory Visit (INDEPENDENT_AMBULATORY_CARE_PROVIDER_SITE_OTHER): Payer: Medicare HMO

## 2019-12-23 DIAGNOSIS — Z87891 Personal history of nicotine dependence: Secondary | ICD-10-CM

## 2019-12-23 DIAGNOSIS — R011 Cardiac murmur, unspecified: Secondary | ICD-10-CM

## 2019-12-23 DIAGNOSIS — Z853 Personal history of malignant neoplasm of breast: Secondary | ICD-10-CM

## 2019-12-23 DIAGNOSIS — Z01810 Encounter for preprocedural cardiovascular examination: Secondary | ICD-10-CM | POA: Diagnosis not present

## 2019-12-23 DIAGNOSIS — E782 Mixed hyperlipidemia: Secondary | ICD-10-CM

## 2019-12-23 LAB — MYOCARDIAL PERFUSION IMAGING
LV dias vol: 57 mL (ref 46–106)
LV sys vol: 16 mL
Peak HR: 96 {beats}/min
Rest HR: 60 {beats}/min
SDS: 1
SRS: 3
SSS: 4
TID: 1.03

## 2019-12-23 MED ORDER — AMINOPHYLLINE 25 MG/ML IV SOLN
100.0000 mg | Freq: Once | INTRAVENOUS | Status: AC
  Administered 2019-12-23: 100 mg via INTRAVENOUS

## 2019-12-23 MED ORDER — REGADENOSON 0.4 MG/5ML IV SOLN
0.4000 mg | Freq: Once | INTRAVENOUS | Status: AC
  Administered 2019-12-23: 0.4 mg via INTRAVENOUS

## 2019-12-23 MED ORDER — TECHNETIUM TC 99M TETROFOSMIN IV KIT
32.1000 | PACK | Freq: Once | INTRAVENOUS | Status: AC | PRN
  Administered 2019-12-23: 32.1 via INTRAVENOUS

## 2019-12-23 MED ORDER — TECHNETIUM TC 99M TETROFOSMIN IV KIT
10.1000 | PACK | Freq: Once | INTRAVENOUS | Status: AC | PRN
  Administered 2019-12-23: 10.1 via INTRAVENOUS

## 2020-02-12 IMAGING — MR MR LUMBAR SPINE W/O CM
4 of 5 series · 25 of 48 positions shown · non-contrast
Comparison: Radiography 05/08/2018.  CT abdomen 05/10/2015.

CLINICAL DATA: Bilateral leg pain and weakness over the last
several years.

EXAM:
MRI LUMBAR SPINE WITHOUT CONTRAST
TECHNIQUE: Multiplanar, multisequence MR imaging of the lumbar spine was
performed. No intravenous contrast was administered.

[Series 4: T1 · sagittal · 4.0mm · 0.55mm/px · 6 of 13 slices shown (1 of 2)]
[im 1/13]
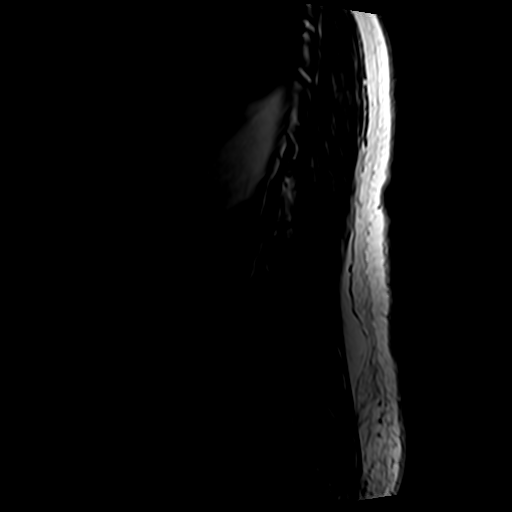
[im 3/13]
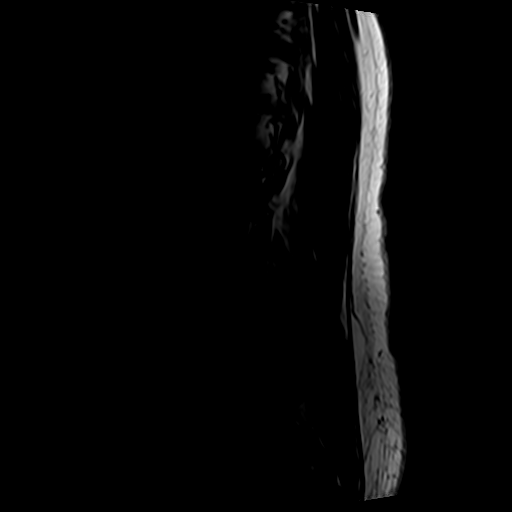
[im 5/13]
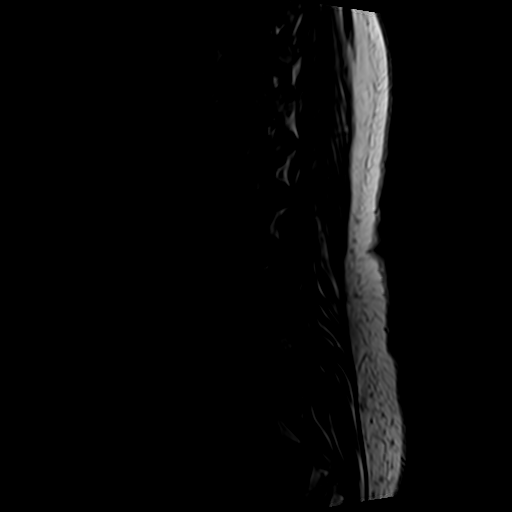
[im 8/13]
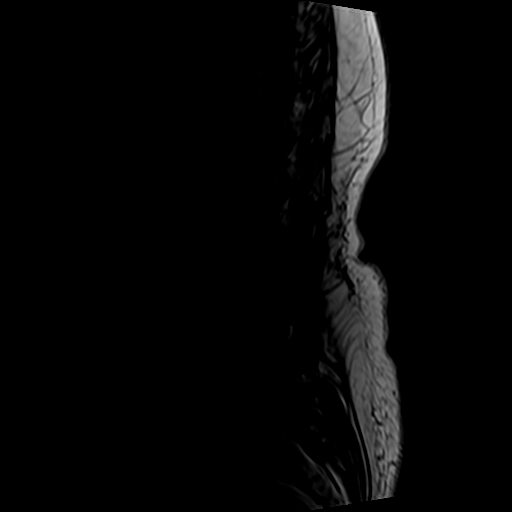
[im 10/13]
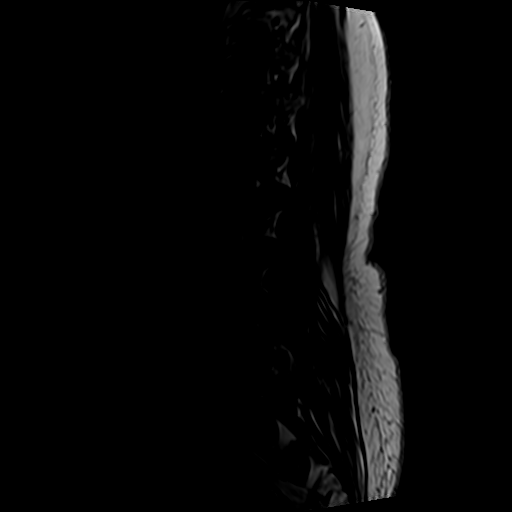
[im 13/13]
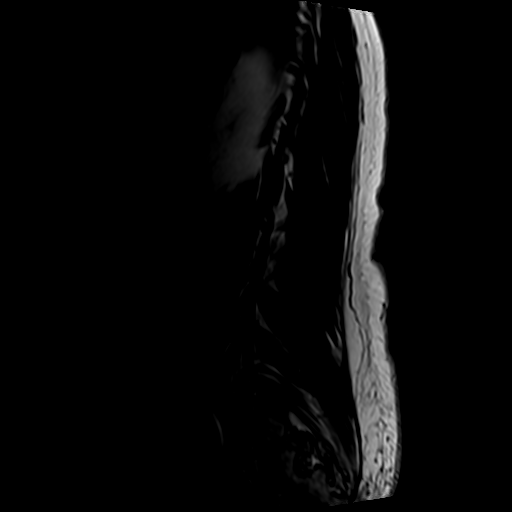

[Series 5: T2 · sagittal · 4.0mm · 0.55mm/px · 6 of 13 slices shown (1 of 2)]
[im 1/13]
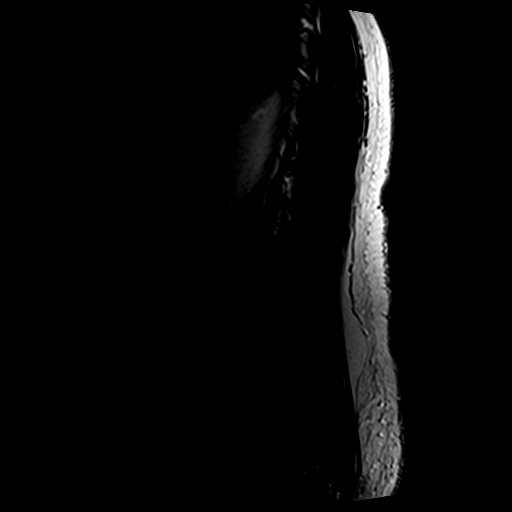
[im 3/13]
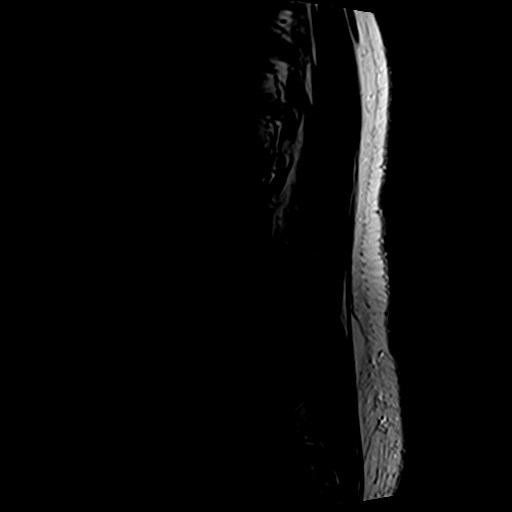
[im 5/13]
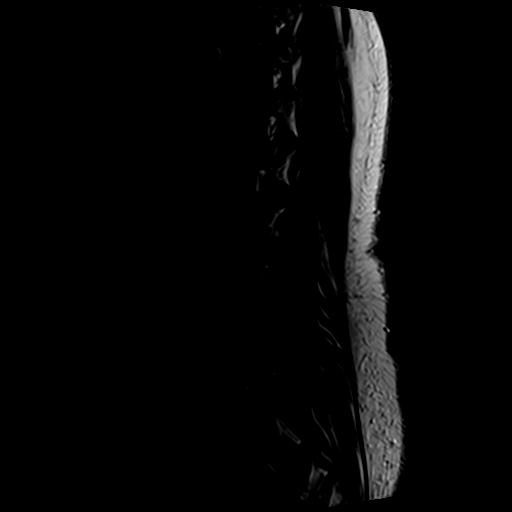
[im 8/13]
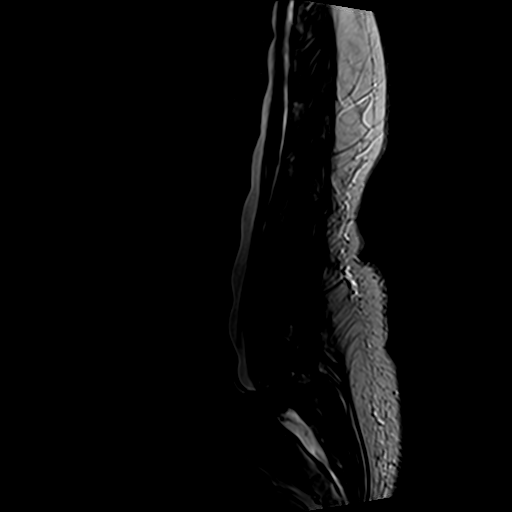
[im 10/13]
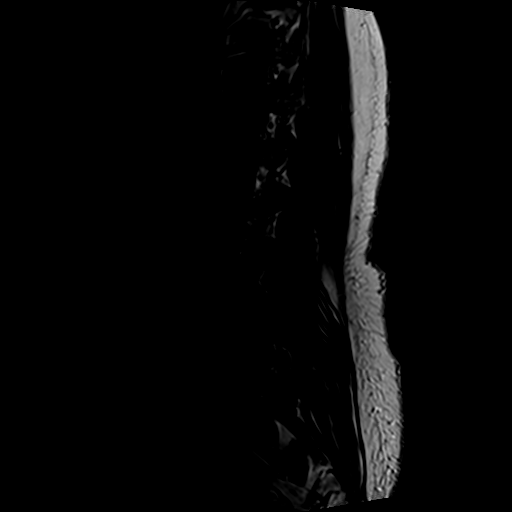
[im 13/13]
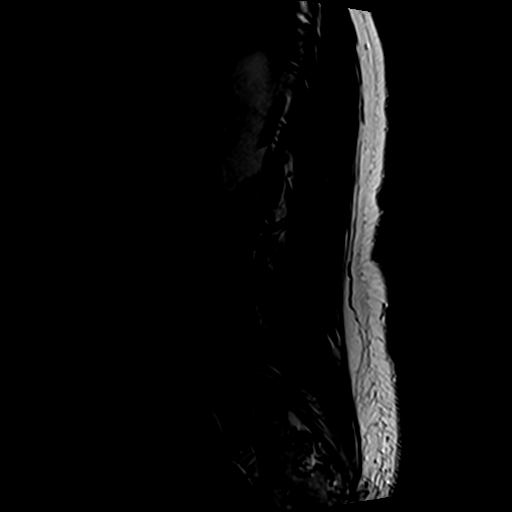

[Series 6: T2 · axial · 4.0mm · 0.70mm/px · z∈[-110,+90]mm · 9 of 33 slices shown (2 of 2)]
[im 1/33]
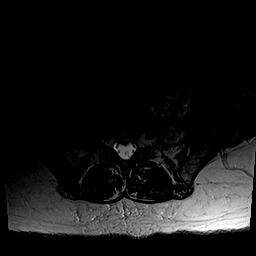
[im 5/33]
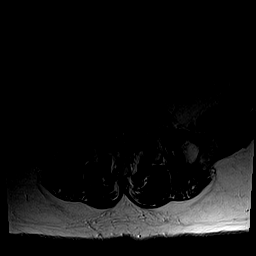
[im 10/33]
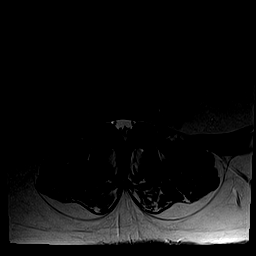
[im 14/33]
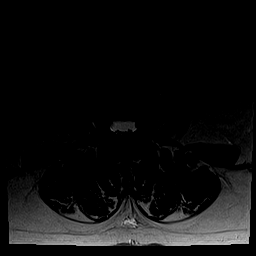
[im 17/33]
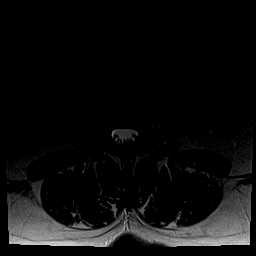
[im 19/33]
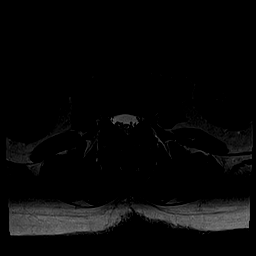
[im 23/33]
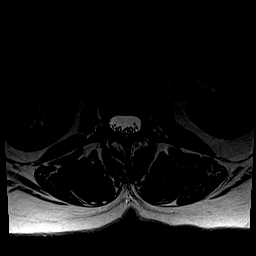
[im 28/33]
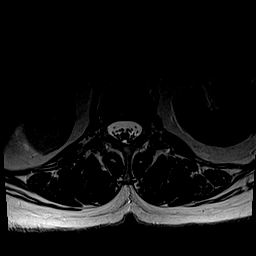
[im 33/33]
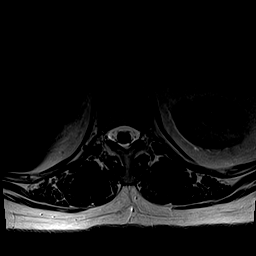

[Series 7: T1 · axial · 4.0mm · 0.35mm/px · z∈[-110,+64]mm · 4 of 33 slices shown (2 of 2)]
[im 1/33]
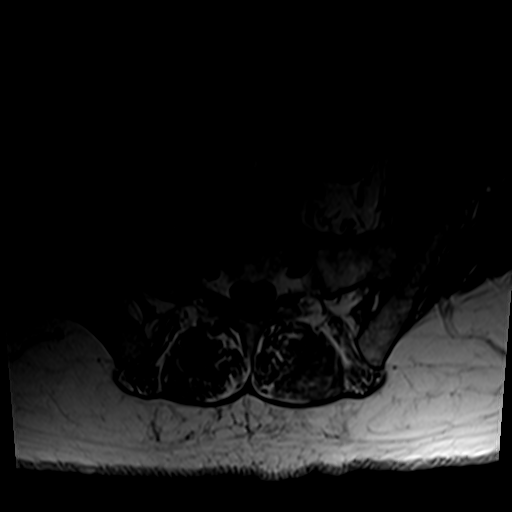
[im 5/33]
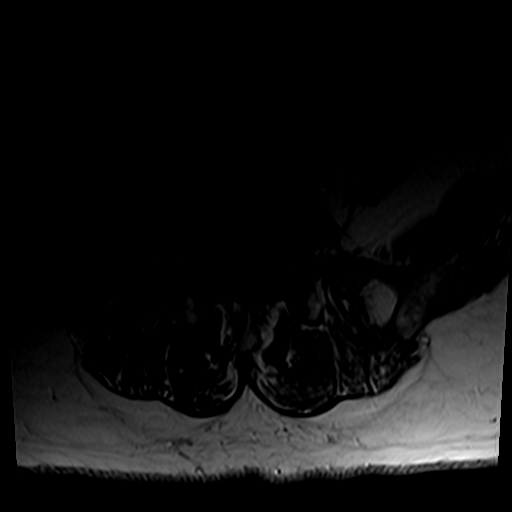
[im 17/33]
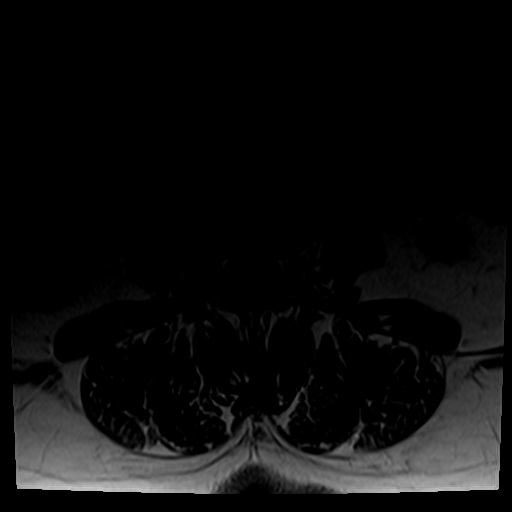
[im 28/33]
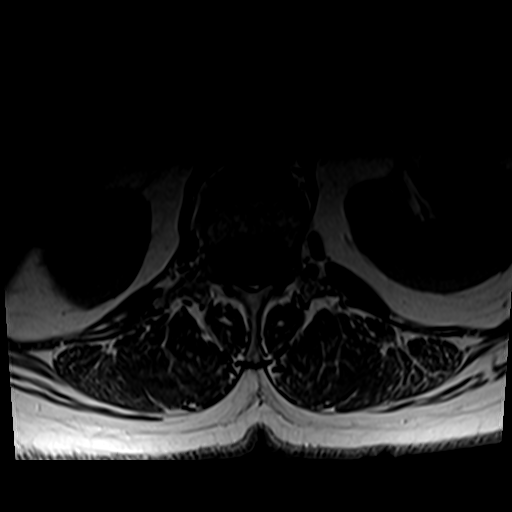

[25 of 48 positions shown; findings below may reference images not displayed]

FINDINGS: Segmentation:  5 lumbar type vertebral bodies.

Alignment:  9 mm anterolisthesis L5-S1.

Vertebrae:  No fracture or primary bone lesion.

Conus medullaris and cauda equina: Conus extends to the L1 level.
Conus and cauda equina appear normal.

Paraspinal and other soft tissues: Negative

Disc levels:

No significant finding at L3-4 or above. No canal or foraminal
stenosis.

L4-5: Minimal noncompressive disc bulge. Facet osteoarthritis with
mild edema which could contribute to back pain. No compressive
stenosis.

L5-S1: Chronic bilateral facet arthropathy with 9 mm of
anterolisthesis. Bulging of the disc. Canal and foraminal stenosis
that could cause neural compression on either or both sides. No pars
defect was seen on the CT scan of 0563, but it is possible that
there could be acquired pars deficiency in this patient with chronic
facet arthropathy and slippage.
IMPRESSION: L4-5: No stenosis. Facet osteoarthritis with mild edema that could
be symptomatic.

L5-S1: Advanced bilateral facet arthropathy with 9 mm of
anterolisthesis. Bulging of the disc. Stenosis of the canal and
foramina that could cause neural compression on either or both sides
and could certainly relate to low back pain. I think the primary
process is that of facet arthropathy rather than primary pars
defect, but it is possible there could be an acquired pars fracture
in addition to the facet arthropathy.

## 2020-03-13 ENCOUNTER — Encounter: Payer: Self-pay | Admitting: Cardiology

## 2020-05-25 ENCOUNTER — Encounter: Payer: Self-pay | Admitting: Cardiology

## 2020-05-25 ENCOUNTER — Ambulatory Visit (INDEPENDENT_AMBULATORY_CARE_PROVIDER_SITE_OTHER): Payer: Medicare HMO | Admitting: Cardiology

## 2020-05-25 ENCOUNTER — Other Ambulatory Visit: Payer: Self-pay

## 2020-05-25 VITALS — BP 116/72 | HR 80 | Ht 60.0 in | Wt 138.8 lb

## 2020-05-25 DIAGNOSIS — Z87891 Personal history of nicotine dependence: Secondary | ICD-10-CM

## 2020-05-25 DIAGNOSIS — E782 Mixed hyperlipidemia: Secondary | ICD-10-CM

## 2020-05-25 NOTE — Patient Instructions (Signed)
Medication Instructions:  Your physician recommends that you continue on your current medications as directed. Please refer to the Current Medication list given to you today.  *If you need a refill on your cardiac medications before your next appointment, please call your pharmacy*   Lab Work: NONE If you have labs (blood work) drawn today and your tests are completely normal, you will receive your results only by: Marland Kitchen MyChart Message (if you have MyChart) OR . A paper copy in the mail If you have any lab test that is abnormal or we need to change your treatment, we will call you to review the results.   Testing/Procedures: NONE  Follow-Up: At [facility], you and your health needs are our priority.  As part of our continuing mission to provide you with exceptional heart care, we have created designated Provider Care Teams.  These Care Teams include your primary Cardiologist (physician) and Advanced Practice Providers (APPs -  Physician Assistants and Nurse Practitioners) who all work together to provide you with the care you need, when you need it.  We recommend signing up for the patient portal called "MyChart".  Sign up information is provided on this After Visit Summary.  MyChart is used to connect with patients for Virtual Visits (Telemedicine).  Patients are able to view lab/test results, encounter notes, upcoming appointments, etc.  Non-urgent messages can be sent to your provider as well.   To learn more about what you can do with MyChart, go to [email or web address].    Your next appointment:   12 month(s)  The format for your next appointment:   In Person  Provider:   Jyl Heinz, MD

## 2020-05-25 NOTE — Progress Notes (Signed)
Cardiology Office Note:    Date:  05/25/2020   ID:  Vanessa Blackburn, DOB [birth year], MRN [id number]  PCP:  Enid Skeens., MD  Cardiologist:  Jenean Lindau, MD   Referring MD: Enid Skeens., MD    ASSESSMENT:    1. History of smoking   2. Mixed dyslipidemia    PLAN:    In order of problems listed above:  1. Merry prevention stressed with the patient.  Importance of compliance with diet medication stressed and she vocalized understanding.  Importance of regular exercise stressed. 2. Mixed dyslipidemia: Diet was emphasized and she is taking her statin regularly.  Lipids followed by primary care physician. 3. History of smoking: She quit many years ago and promises never to smoke again. 4. Patient will be seen in follow-up appointment in 12 months or earlier if the patient has any concerns    Medication Adjustments/Labs and Tests Ordered: Current medicines are reviewed at length with the patient today.  Concerns regarding medicines are outlined above.  No orders of the defined types were placed in this encounter.  No orders of the defined types were placed in this encounter.    No chief complaint on file.    History of Present Illness:    Vanessa Blackburn is a 69 y.o. female.  Patient was evaluated by me for preoperative purposes.  She underwent stress testing which was unremarkable and the details are mentioned below.  She leads a sedentary lifestyle.  She walks on a regular basis with limitations.  No chest pain orthopnea or PND.  She decided against surgery as her oncologist told her that she has significant osteoporosis and the surgery would be risky.  At the time of my evaluation, the patient is alert awake oriented and in no distress.  History reviewed. No pertinent past medical history.  History reviewed. No pertinent surgical history.  Current Medications: Current Meds  Medication Sig  . ALPRAZolam (XANAX) 0.5 MG tablet Take 0.5 mg  by mouth at bedtime as needed.   Marland Kitchen anastrozole (ARIMIDEX) 1 MG tablet Take 1 mg by mouth.  Marland Kitchen atorvastatin (LIPITOR) 10 MG tablet Take 10 mg by mouth daily.   Marland Kitchen buPROPion (WELLBUTRIN XL) 150 MG 24 hr tablet   . calcitonin, salmon, (MIACALCIN/FORTICAL) 200 UNIT/ACT nasal spray   . CALCIUM CARBONATE PO Take by mouth.  . FORTEO 600 MCG/2.4ML SOPN   . Multiple Vitamin (MULTIVITAMIN) capsule Take by mouth.  Marland Kitchen omeprazole (PRILOSEC) 20 MG capsule   . venlafaxine XR (EFFEXOR-XR) 37.5 MG 24 hr capsule Take 37.5 mg by mouth.     Allergies:   Ciprofloxacin   Social History   Socioeconomic History  . Marital status: Divorced    Spouse name: Not on file  . Number of children: Not on file  . Years of education: Not on file  . Highest education level: Not on file  Occupational History  . Not on file  Tobacco Use  . Smoking status: Unknown If Ever Smoked  . Smokeless tobacco: Never Used  Substance and Sexual Activity  . Alcohol use: Not on file  . Drug use: Not on file  . Sexual activity: Not on file  Other Topics Concern  . Not on file  Social History Narrative  . Not on file   Social Determinants of Health   Financial Resource Strain:   . Difficulty of Paying Living Expenses:   Food Insecurity:   . Worried About Charity fundraiser  in the Last Year:   . [place] in the Last Year:   Transportation Needs:   . Film/video editor (Medical):   Marland Kitchen Lack of Transportation (Non-Medical):   Physical Activity:   . Days of Exercise per Week:   . Minutes of Exercise per Session:   Stress:   . Feeling of Stress :   Social Connections:   . Frequency of Communication with Friends and Family:   . Frequency of Social Gatherings with Friends and Family:   . Attends Religious Services:   . Active Member of Clubs or Organizations:   . Attends Archivist Meetings:   Marland Kitchen Marital Status:      Family History: The patient's family history includes Cancer in her maternal aunt,  maternal grandfather, maternal grandmother, paternal aunt, and paternal grandmother; Cancer (age of onset: 69) in her brother; Cancer (age of onset: 47) in her sister; Cancer (age of onset: 61) in her sister; Cancer (age of onset: 37) in her father; Cancer (age of onset: 73) in her mother.  ROS:   Please see the history of present illness.    All other systems reviewed and are negative.  EKGs/Labs/Other Studies Reviewed:    The following studies were reviewed today: I discussed my findings with the patient at length   Recent Labs: 11/25/2019: BUN 11; Creatinine, Ser 0.75; Potassium 3.7; Sodium 140  Recent Lipid Panel No results found for: CHOL, TRIG, HDL, CHOLHDL, VLDL, LDLCALC, LDLDIRECT  Physical Exam:    VS:  BP 116/72   Pulse 80   Ht 5' (1.524 m)   Wt 138 lb 12.8 oz (63 kg)   SpO2 96%   BMI 27.11 kg/m     Wt Readings from Last 3 Encounters:  05/25/20 138 lb 12.8 oz (63 kg)  12/23/19 148 lb (67.1 kg)  11/25/19 148 lb (67.1 kg)     GEN: Patient is in no acute distress HEENT: Normal NECK: No JVD; No carotid bruits LYMPHATICS: No lymphadenopathy CARDIAC: Hear sounds regular, 2/6 systolic murmur at the apex. RESPIRATORY:  Clear to auscultation without rales, wheezing or rhonchi  ABDOMEN: Soft, non-tender, non-distended MUSCULOSKELETAL:  No edema; No deformity  SKIN: Warm and dry NEUROLOGIC:  Alert and oriented x 3 PSYCHIATRIC:  Normal affect   Signed, Jenean Lindau, MD  05/25/2020 1:01 PM    [facility]

## 2020-06-01 ENCOUNTER — Ambulatory Visit: Payer: Medicare HMO | Admitting: Cardiology

## 2020-06-02 ENCOUNTER — Ambulatory Visit: Payer: Medicare HMO | Admitting: Cardiology

## 2020-09-20 NOTE — Progress Notes (Signed)
[name]  [address] [place],  [name]  [address] [phone]  Clinic Day:  09/21/2020  Referring physician: Enid Skeens., MD   This document serves as a record of services personally performed by [name], MD. It was created on their behalf by [facility], a trained medical scribe. The creation of this record is based on the scribe's personal observations and the provider's statements to them.   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 She had locally advanced disease in the right medial breast measuring about 3 cm, with involvement of the skin.  Biopsy revealed invasive ductal carcinoma.  Estrogen and progesterone receptors were positive and her 2 Neu negative.  Ki-67 was 14%.  There was no palpable adenopathy, but MRI revealed a 5 mm right axillary node, which was suspicious for metastasis.  PET scan did not show any distant metastasis.  She initially refused chemotherapy, so was placed on neoadjuvant tamoxifen, but had a minimal response to this.  She then developed a melanoma on the upper outer quadrant of the right breast and was seen at [facility].  She underwent bilateral mastectomies.  Pathology of the right breast revealed a 2.8 cm, grade 2, invasive ductal carcinoma with 1/14 nodes positive.  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  Pathology of the skin revealed a 0.4 mm melanoma, Clark's level 2 with a depth of 0.25 mm.  She had a second melanoma removed from the left lateral trunk in March, which was found to be a stage 0 (Tis N0 M0), arising in a dysplastic nevus.  Wide excision  revealed no residual melanoma.  The patient was recommended for adjuvant chemotherapy for her breast cancer and was placed on docetaxel/cyclophosphamide in April 2016.  Unfortunately, she developed a severe skin rash requiring IV and oral steroids, felt to be secondary to docetaxel.  We, therefore, switched her chemotherapy to CMF.  She tolerated this fairly well, but developed acute colitis after a third cycle.  After her 5th cycle, she developed recurrent symptoms of colitis, as well as a progressive rash and weight loss.  Chemotherapy was then discontinued, but we had only planned one more dose of the CMF.  She was then referred for radiation to the right chest wall, which was completed in December 2016.  She was placed on anastrozole 75m daily in January 2017.  Due to her personal family history of malignancy, she underwent testing for hereditary breast and ovarian cancer with the [facility]vaNext Breast and Ovarian Cancer panel test.  This did not reveal any clinically significant mutations or variants of uncertain significance.  She had an EGD and colonoscopy in October 2015.  Colonoscopy revealed diverticular disease, but no polyps.  EGD revealed an irregular GE junction.  Biopsy revealed benign squamocolumnar mucosa with chronic inflammation reactive changes, no intestinal metaplasia dysplasia or malignancy were identified.  She underwent impaired esophageal dilatation at that time and was advised to continue omeprazole 20 mg daily.  Bone density scan in December 2017 revealed worsening osteoporosis with a T-score -3.3 in the spine.  She decided against treatment with denosumab, so was placed  on Miacalcin nasal spray in May 2018.  She has had multiple skin cancers removed, but she has not had another melanoma.   Bone density scan in December 2018 revealed persistent osteoporosis with T-score of -3.1 in the spine, which was a 3.7% improvement in the bone density and a T-score of -2 in the femur which was a  0.5% improvement in bone density.  Miacalcin was therefore continued.  She has had chronic pain of the back and mastectomy sites, especially on the right.  MRI lumbar spine in August 2019 revealed degenerative disc disease with some disc bulging, as well as advanced arthropathy at L5-S1 with stenosis of the canal and foramina causing neural compression.  She is a previous smoker and smoked for about 40 years, but not heavily, reporting about a pack every 3 days, so has an approximately 13 pack year history.  She quit smoking in 2016.  In 2020, she was placed Forteo injections for her osteoporosis.  She continues to follow with her dermatologist and has had numerous skin cancers resected.   She continues to report numbness and pain in her mastectomy sites and axillary areas.  She has had some lymphedema of the right upper extremity.   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  However, this became infected and she was placed on antibiotics, and states that the wound is improving.  She hit her foot on a ladder and had a fall yesterday and feels she might have fractured some toes.  She comes in wearing a boot today.  She states that she still has two skin lesions of be removed, but she is hoping to find another dermatologist.  She rates her pain as a 10/10 of her left foot.  She continues anastrozole daily without significant difficulty.  Bone density scan from June revealed osteoporosis with a T-score of -2.6 of the AP spine, previously -3.1.  Right femur neck measures -2.2, previously -1.9.  Dual femur total mean measures -1.7, stable.  She continues Forteo injections.  She normally undergoes routine blood work with her family doctor.  Her  appetite is good, and she has gained 2 pounds since her last visit.  She denies fever, chills or other signs of infection.  She denies nausea, vomiting, bowel issues,  or abdominal pain.  She denies sore throat, cough, dyspnea, or chest pain.   REVIEW OF SYSTEMS:  Review of Systems  Constitutional: Negative.   HENT:  Negative.   Eyes: Negative.   Respiratory: Negative.   Cardiovascular: Negative.   Gastrointestinal: Negative.   Endocrine: Negative.   Genitourinary: Negative.    Musculoskeletal: Positive for back pain (chronic).       Left foot pain  Skin: Negative.   Neurological: Negative.   Hematological: Negative.   Psychiatric/Behavioral: The patient is nervous/anxious (mild).      VITALS:  Blood pressure 139/90, pulse 79, temperature 98.4 F (36.9 C), temperature source Oral, resp. rate 18, height 5' (1.524 m), weight 140 lb 6.4 oz (63.7 kg), SpO2 96 %.  Wt Readings from Last 3 Encounters:  09/21/20 140 lb 6.4 oz (63.7 kg)  05/25/20 138 lb 12.8 oz (63 kg)  12/23/19 148 lb (67.1 kg)    Body mass index is 27.42 kg/m.  Performance status (ECOG): 1 - Symptomatic but completely ambulatory  PHYSICAL EXAM:  Physical Exam Constitutional:      General: She  is not in acute distress.    Appearance: Normal appearance. She is normal weight.  HENT:     Head: Normocephalic and atraumatic.  Eyes:     General: No scleral icterus.    Extraocular Movements: Extraocular movements intact.     Conjunctiva/sclera: Conjunctivae normal.     Pupils: Pupils are equal, round, and reactive to light.  Cardiovascular:     Rate and Rhythm: Normal rate and regular rhythm.     Pulses: Normal pulses.     Heart sounds: Normal heart sounds. No murmur heard.  No friction rub. No gallop.   Pulmonary:     Effort: Pulmonary effort is normal. No respiratory distress.     Breath sounds: Normal breath sounds.  Chest:     Breasts:        Right: Absent.        Left: Absent.     Comments: Bilateral mastectomies are negative.  She has some nodular fibrosis of the tendon in the right axilla.   Abdominal:     General: Bowel sounds are normal. There is no distension.      Palpations: Abdomen is soft. There is no mass.     Tenderness: There is no abdominal tenderness.  Musculoskeletal:        General: Normal range of motion.     Cervical back: Normal range of motion and neck supple.     Right lower leg: No edema.     Left lower leg: No edema.  Lymphadenopathy:     Cervical: No cervical adenopathy.  Skin:    General: Skin is warm and dry.  Neurological:     General: No focal deficit present.     Mental Status: She is alert and oriented to person, place, and time. Mental status is at baseline.  Psychiatric:        Mood and Affect: Mood normal.        Behavior: Behavior normal.        Thought Content: Thought content normal.        Judgment: Judgment normal.    LABS:  No flowsheet data found. CMP Latest Ref Rng & Units 11/25/2019  Glucose 65 - 99 mg/dL 120(H)  BUN 8 - 27 mg/dL 11  Creatinine 0.57 - 1.00 mg/dL 0.75  Sodium 134 - 144 mmol/L 140  Potassium 3.5 - 5.2 mmol/L 3.7  Chloride 96 - 106 mmol/L 101  CO2 20 - 29 mmol/L 26  Calcium 8.7 - 10.3 mg/dL 10.6(H)    STUDIES:   She underwent a CT chest, abdomen and pelvis with contrast on 03/15/2020 showing: 1. Stable exam. No new or progressive findings in the chest, abdomen, or pelvis. No evidence for metastatic disease. 2. Left colonic diverticulosis without diverticulitis. 3. Aortic Atherosclerosis (ICD10-I70.0) and Emphysema (ICD10-J43.9).  She underwent a DXA for bone mineral density on 03/29/2020 showing osteoporosis with a T-score of -2.6 of the AP spine, previously -3.1.  Right femur neck measures -2.2, previously -1.9.  Dual femur total mean measures -1.7, stable.    Allergies:  Allergies  Allergen Reactions  . Ciprofloxacin Other (See Comments)    unknown unknown unknown    Current Medications: Current Outpatient Medications  Medication Sig Dispense Refill  . ALPRAZolam (XANAX) 0.5 MG tablet Take 1 tablet by mouth as needed.    Marland Kitchen anastrozole (ARIMIDEX) 1 MG tablet Take 1 mg  by mouth.    Marland Kitchen atorvastatin (LIPITOR) 10 MG tablet Take 10 mg by mouth daily.     Marland Kitchen  buPROPion (WELLBUTRIN XL) 150 MG 24 hr tablet     . calcitonin, salmon, (MIACALCIN/FORTICAL) 200 UNIT/ACT nasal spray     . DROPLET PEN NEEDLES 31G X 8 MM MISC     . FORTEO 620 MCG/2.48ML SOPN     . Multiple Vitamin (MULTIVITAMIN) capsule Take by mouth.    Marland Kitchen omeprazole (PRILOSEC) 20 MG capsule     . venlafaxine XR (EFFEXOR-XR) 37.5 MG 24 hr capsule Take 37.5 mg by mouth.     No current facility-administered medications for this visit.     ASSESSMENT & PLAN:   Assessment:   1. 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  She will continue anastrozole daily for least a total of 5 years, but we would likely recommend extended adjuvant hormonal therapy for total of 10 years due to the stage of her disease as long as her bone density continues to improve.  2. History of stage IA melanoma of the right breast treated with surgery.  3. 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  4. Osteoporosis, which was slightly improved in December 2018 with calcitonin.  She is overdue for bone density scan, so I will get that scheduled.  5. Mild lymphedema of the right upper extremity and chest wall, stable.  6. History of multiple non melanoma skin cancers.  She will continue to follow up with the dermatologist regularly.  7.  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  This became infected and she has been placed on antibiotics and the wound has finally started to heal.  Plan: We will plan to see her back in 6 months for re-examination.  I do advise that she receive the COVID-19 vaccine.  The patient understands the plans discussed today and is in agreement with them.  She knows to contact our office if she develops concerns regarding her breast cancer or its treatment.   I provided  20 minutes of face-to-face time during this this encounter and > 50% was spent counseling as documented under my assessment and plan.    Derwood Kaplan, MD [facility] [address] [place] [address] Dept: [phone] Dept Fax: [phone]   I, Rita Ohara, am acting as scribe for Derwood Kaplan, MD  I have reviewed this report as typed by the medical scribe, and it is complete and accurate.

## 2020-09-21 ENCOUNTER — Inpatient Hospital Stay: Payer: Medicare HMO | Attending: Oncology | Admitting: Oncology

## 2020-09-21 ENCOUNTER — Other Ambulatory Visit: Payer: Self-pay

## 2020-09-21 ENCOUNTER — Encounter: Payer: Self-pay | Admitting: Oncology

## 2020-09-21 VITALS — BP 139/90 | HR 79 | Temp 98.4°F | Resp 18 | Ht 60.0 in | Wt 140.4 lb

## 2020-09-21 DIAGNOSIS — C50311 Malignant neoplasm of lower-inner quadrant of right female breast: Secondary | ICD-10-CM | POA: Diagnosis not present

## 2020-09-21 DIAGNOSIS — I890 Lymphedema, not elsewhere classified: Secondary | ICD-10-CM

## 2020-09-21 DIAGNOSIS — Z170 Estrogen receptor positive status [ER+]: Secondary | ICD-10-CM | POA: Diagnosis not present

## 2020-10-23 ENCOUNTER — Encounter: Payer: Self-pay | Admitting: Oncology

## 2020-10-23 DIAGNOSIS — M810 Age-related osteoporosis without current pathological fracture: Secondary | ICD-10-CM | POA: Insufficient documentation

## 2021-01-17 ENCOUNTER — Other Ambulatory Visit: Payer: Self-pay | Admitting: Hematology and Oncology

## 2021-01-17 DIAGNOSIS — C50311 Malignant neoplasm of lower-inner quadrant of right female breast: Secondary | ICD-10-CM

## 2021-01-17 DIAGNOSIS — Z170 Estrogen receptor positive status [ER+]: Secondary | ICD-10-CM

## 2021-01-18 ENCOUNTER — Telehealth: Payer: Self-pay | Admitting: Oncology

## 2021-01-18 NOTE — Telephone Encounter (Signed)
Per 3/30 Staff Msg,patient scheduled for June Appt.  Called patient with Appt

## 2021-02-19 ENCOUNTER — Telehealth: Payer: Self-pay

## 2021-02-19 HISTORY — PX: CHOLECYSTECTOMY: SHX55

## 2021-03-26 ENCOUNTER — Inpatient Hospital Stay: Payer: Medicare HMO | Attending: Hematology and Oncology | Admitting: Hematology and Oncology

## 2021-03-26 ENCOUNTER — Telehealth: Payer: Self-pay | Admitting: Hematology and Oncology

## 2021-03-26 ENCOUNTER — Encounter: Payer: Self-pay | Admitting: Hematology and Oncology

## 2021-03-26 ENCOUNTER — Other Ambulatory Visit: Payer: Self-pay

## 2021-03-26 VITALS — BP 155/72 | HR 76 | Temp 98.7°F | Resp 18 | Ht 60.0 in | Wt 136.9 lb

## 2021-03-26 DIAGNOSIS — Z170 Estrogen receptor positive status [ER+]: Secondary | ICD-10-CM | POA: Diagnosis not present

## 2021-03-26 DIAGNOSIS — C50311 Malignant neoplasm of lower-inner quadrant of right female breast: Secondary | ICD-10-CM

## 2021-03-26 NOTE — Progress Notes (Signed)
[facility]  [address] [place],  [place]  [address] [phone]  Clinic Day:  03/26/2021  Referring physician: Enid Skeens., MD   CHIEF COMPLAINT:  CC:   Clinical stage III B hormone receptor positive breast cancer  Current Treatment:   Anastrozole 1 mg daily for total of 10 years   HISTORY OF PRESENT ILLNESS:  Vanessa Blackburn is a 70 y.o. 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 She had locally advanced disease in the right medial breast measuring about 3 cm, with involvement of the skin.  Biopsy revealed invasive ductal carcinoma.  Estrogen and progesterone receptors were positive and her 2 Neu negative.  Ki-67 was 14%.  There was no palpable adenopathy, but MRI revealed a 5 mm right axillary node, which was suspicious for metastasis.  PET scan did not show any distant metastasis.  She initially refused chemotherapy, so was placed on neoadjuvant tamoxifen, but had a minimal response to this.  She then developed a melanoma on the upper outer quadrant of the right breast and was seen at [facility].  She underwent bilateral mastectomies.  Pathology of the right breast revealed a 2.8 cm, grade 2, invasive ductal carcinoma with 1/14 nodes positive.  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  Pathology of the skin revealed a 0.4 mm melanoma, Clark's level 2 with a depth of 0.25 mm.  She had a second melanoma removed from the left lateral trunk in March, which was found to be a stage 0 (Tis N0 M0), arising in a dysplastic nevus.  Wide excision revealed no residual melanoma.  The patient was recommended for adjuvant chemotherapy for her breast cancer and was placed on docetaxel/cyclophosphamide in April 2016.  Unfortunately, she developed a severe skin rash requiring IV and oral steroids, felt  to be secondary to docetaxel.  We, therefore, switched her chemotherapy to CMF.  She tolerated this fairly well, but developed acute colitis after a third cycle.  After her 5th cycle, she developed recurrent symptoms of colitis, as well as a progressive rash and weight loss.  Chemotherapy was then discontinued, but we had only planned one more dose of the CMF.  She was then referred for radiation to the right chest wall, which was completed in December 2016.  She was placed on anastrozole 25m daily in January 2017.  Due to her personal family history of malignancy, she underwent testing for hereditary breast and ovarian cancer with the [facility] Breast and Ovarian Cancer panel test.  This did not reveal any clinically significant mutations or variants of uncertain significance.  She had an EGD and colonoscopy in October 2015.  Colonoscopy revealed diverticular disease, but no polyps.  EGD revealed an irregular GE junction.  Biopsy revealed benign squamocolumnar mucosa with chronic inflammation reactive changes, no intestinal metaplasia dysplasia or malignancy were identified.  She underwent impaired esophageal dilatation at that time and was advised to continue omeprazole 20 mg daily.  She reported abdominal distension and saw Dr. MLyda Jesterin 2017.  CT chest, abdomen and pelvis at [facility]in September 2017 did not reveal any evidence of malignancy or explanation of her symptoms.  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  She was placed on furosemide 20 mg  daily.  At her visit in November 2017, we recommended that she decrease furosemide to every other day or as needed.  Furosemide was subsequently discontinued.  She also had borderline hypercalcemia, but was not taking any calcium supplement.  The hypercalcemia resolved.  Bone density scan in December 2017 revealed worsening osteoporosis with a T-score -3.3 in the spine.  She decided  against treatment with denosumab, so was placed on Miacalcin nasal spray in May 2018.  She has had multiple skin cancers removed, but she has not had another melanoma.   Bone density scan in December 2018 revealed persistent osteoporosis with T-score of -3.1 in the spine, which was a 3.7% improvement in the bone density and a T-score of -2 in the femur which was a 0.5% improvement in bone density.  Miacalcin was therefore continued.  Due to tenderness in the right mastectomy site, the patient underwent a ultrasound of the area in January 2019, which did not reveal any abnormality.  She had back pain and saw an orthopedic surgeon in [place], Dr. Ernestina Patches in August 2019 and was treated with epidural.  The epidural helped initially, however, a 2nd and 3rd injection did not help, so surgery was recommended.  MRI lumbar spine in August 2019 revealed degenerative disc disease with some disc bulging, as well as advanced arthropathy at L5-S1 with stenosis of the canal and foramina causing neural compression.  She is a previous smoker and smoked for about 40 years, but not heavily, reporting about a pack every 3 days, so has an approximately 13 pack year history.  She quit smoking 3 years ago in 2016.  In 2020, she was placed Forteo injections for her osteoporosis, in addition to the [place].   At her visit in May 2021, she continued anastrozole daily without significant difficulty. She had multiple complaints, including a chronic cough productive of grayish sputum.  Chest x-ray in February 2021 did not reveal any active cardiopulmonary disease.  She reported intermittent left chest pain, for which she had seen Dr. Geraldo Pitter and undergone stress test, which was apparently normal. She reported chronic nausea and vomiting with a 10 lb weight loss, without dysphagia, abdominal pain, diarrhea or constipation.  She was up-to-date on screening colonoscopy.  She reported occasional urinary and stool incontinence with  coughing.  She reports intermittent hematuria, urinary frequency and urgency.  She had seen her gynecologist regarding these symptoms.  She reported having additional skin cancers removed from the left leg and continued following with her dermatologist. She continued Forteo and Miacalcin for osteoporosis, as well as a multivitamin with calcium. She was scheduled for back surgery in July.   Due to all her symptoms, she underwent CT chest, abdomen and pelvis, which did not reveal any explanation for her symptoms.  There were subcentimeter lesions within the liver which were stable and consistent with a benign etiology.  There was left colonic diverticulosis without evidence of diverticulitis.  Bone density scan in June 2021 revealed  Slightly worsened bone density of the left femur neck with a T-score of -2.2, previously -1.9.  There was an increase in the bone density of the spine with a T-score of -2.6, previously -3.1.  We recommended  adding calcium at least daily in addition to her multivitamin.  We discussed alternative treatment for her osteoporosis with alendronate, Reclast, or Prolia, but she declined.  INTERVAL HISTORY:  Vanessa Blackburn is here today for repeat examination.  She denies any changes in her mastectomy site.  Her only complaint today  is right knee pain which is felt to potentially be due to a ligament injury.  She was instructed to contact her physician if her pain did not resolve with conservative measures.  She continues to wear right knee brace.  She states the pain was improving, but has worsened since an episode of leg cramps. She otherwise denies pain. She denies fevers or chills. On further questioning, she still has intermittent nausea. Her appetite is good. Her weight has decreased 4 pounds over last 6 months. She is still just taking a multivitamin daily. Since her last visit, she has had a gallbladder ultrasound in March, which revealed gallbladder polyps.  This was followed by CT abdomen  and pelvis in April , which did not reveal any significant abnormality.  She underwent cholecystectomy on May 2nd.  Pathology was benign.  A CBC and basic metabolic panel at the time of surgery were normal.  REVIEW OF SYSTEMS:  Review of Systems  Constitutional: Negative for appetite change, chills, fatigue, fever and unexpected weight change.  HENT:   Negative for lump/mass, mouth sores and sore throat.   Respiratory: Negative for cough and shortness of breath.   Cardiovascular: Negative for chest pain and leg swelling.  Gastrointestinal: Positive for nausea (Intermittent without vomiting). Negative for abdominal pain, constipation, diarrhea and vomiting.  Endocrine: Negative for hot flashes.  Genitourinary: Negative for difficulty urinating, dysuria, frequency and hematuria.   Musculoskeletal: Positive for arthralgias (knee pain). Negative for back pain and myalgias.  Skin: Negative for rash.  Neurological: Negative for dizziness and headaches.  Hematological: Negative for adenopathy. Does not bruise/bleed easily.  Psychiatric/Behavioral: Negative for depression and sleep disturbance. The patient is not nervous/anxious.    VITALS:  Blood pressure (!) 155/72, pulse 76, temperature 98.7 F (37.1 C), temperature source Oral, resp. rate 18, height 5' (1.524 m), weight 136 lb 14.4 oz (62.1 kg), SpO2 97 %.  Wt Readings from Last 3 Encounters:  03/26/21 136 lb 14.4 oz (62.1 kg)  09/21/20 140 lb 6.4 oz (63.7 kg)  05/25/20 138 lb 12.8 oz (63 kg)    Body mass index is 26.74 kg/m.  Performance status (ECOG): 1 - Symptomatic but completely ambulatory  PHYSICAL EXAM:  Physical Exam Vitals and nursing note reviewed.  Constitutional:      General: She is not in acute distress.    Appearance: Normal appearance.  HENT:     Head: Normocephalic and atraumatic.     Mouth/Throat:     Mouth: Mucous membranes are moist.     Pharynx: Oropharynx is clear. No oropharyngeal exudate or posterior  oropharyngeal erythema.  Eyes:     General: No scleral icterus.    Extraocular Movements: Extraocular movements intact.     Conjunctiva/sclera: Conjunctivae normal.     Pupils: Pupils are equal, round, and reactive to light.  Cardiovascular:     Rate and Rhythm: Normal rate and regular rhythm.     Heart sounds: Normal heart sounds. No murmur heard. No friction rub. No gallop.   Pulmonary:     Effort: Pulmonary effort is normal.     Breath sounds: Normal breath sounds. No wheezing, rhonchi or rales.  Chest:  Breasts:     Right: Absent. No axillary adenopathy or supraclavicular adenopathy.     Left: Absent. No axillary adenopathy or supraclavicular adenopathy.      Comments: Bilateral mastectomy sites are negative. Abdominal:     General: There is no distension.     Palpations: Abdomen is soft. There is  no hepatomegaly, splenomegaly or mass.     Tenderness: There is no abdominal tenderness.     Comments: Surgical laproscopy incision right lower quadrant with persistent eschar and mild surrounding erythema without tenderness, swelling or warmth  Musculoskeletal:        General: Normal range of motion.     Cervical back: Normal range of motion and neck supple. No tenderness.     Right lower leg: No edema.     Left lower leg: No edema.  Lymphadenopathy:     Cervical: No cervical adenopathy.     Upper Body:     Right upper body: No supraclavicular or axillary adenopathy.     Left upper body: No supraclavicular or axillary adenopathy.     Lower Body: No right inguinal adenopathy. No left inguinal adenopathy.  Skin:    General: Skin is warm and dry.     Coloration: Skin is not jaundiced.     Findings: No rash.     Comments:  No suspicious lesions of the back chest or abdomen  Neurological:     Mental Status: She is alert and oriented to person, place, and time.     Cranial Nerves: No cranial nerve deficit.  Psychiatric:        Mood and Affect: Mood normal.        Behavior:  Behavior normal.        Thought Content: Thought content normal.    LABS:  No flowsheet data found. CMP Latest Ref Rng & Units 11/25/2019  Glucose 65 - 99 mg/dL 120(H)  BUN 8 - 27 mg/dL 11  Creatinine 0.57 - 1.00 mg/dL 0.75  Sodium 134 - 144 mmol/L 140  Potassium 3.5 - 5.2 mmol/L 3.7  Chloride 96 - 106 mmol/L 101  CO2 20 - 29 mmol/L 26  Calcium 8.7 - 10.3 mg/dL 10.6(H)     No results found for: CEA1 / No results found for: CEA1 No results found for: PSA1 No results found for: [id number] No results found for: CAN125  No results found for: TOTALPROTELP, ALBUMINELP, A1GS, A2GS, BETS, BETA2SER, GAMS, MSPIKE, SPEI No results found for: TIBC, FERRITIN, IRONPCTSAT No results found for: LDH  STUDIES:  No results found.  Exam(s): [id number] US/US GALLBLADDER-BILIARY (RUQ) CLINICAL DATA:  Abdominal pain, like color stool for 1 week  EXAM: ULTRASOUND ABDOMEN LIMITED RIGHT UPPER QUADRANT  COMPARISON:  None.  FINDINGS: Gallbladder:  Multiple small gallbladder polyps are identified measuring up to 2 mm. No evidence of shadowing gallstones or cholecystitis. Negative sonographic Murphy sign.  Common bile duct:  Diameter: 4 mm  Liver:  No focal lesion identified. Within normal limits in parenchymal echogenicity. Portal vein is patent on color Doppler imaging with normal direction of blood flow towards the liver.  Other: None.  IMPRESSION: 1. Multiple small gallbladder polyps of doubtful clinical significance. Otherwise unremarkable exam.   Exam(s): [id number] CT/CT ABD-PELV W/IV CM CLINICAL DATA:  Right upper quadrant pain and nausea with lilight colored stool. History of bilateral breast cancer and melanoma. Status post right oophorectomy. Bilateral mastectomy. Hernia.  EXAM: CT ABDOMEN AND PELVIS WITH CONTRAST  TECHNIQUE: Multidetector CT imaging of the abdomen and pelvis was performed using the standard protocol following bolus administration of intravenous  contrast.  CONTRAST:  Intravenous and PO contrast administered.  COMPARISON:  Ultrasound abdomen 01/16/2021, CT abdomen pelvis 03/15/2020  FINDINGS: Lower chest: No acute abnormality.  Hepatobiliary: Redemonstration of several subcentimeter hypodensities are too small to characterize. No new hepatic lesion. No gallstones, gallbladder  wall thickening, or pericholecystic fluid. No biliary dilatation.  Pancreas: No focal lesion. Normal pancreatic contour. No surrounding inflammatory changes. No main pancreatic ductal dilatation.  Spleen: Normal in size without focal abnormality.  Adrenals/Urinary Tract:  No adrenal nodule bilaterally.  Bilateral kidneys excrete symmetrically. No hydronephrosis. No hydroureter.  The urinary bladder is unremarkable.  No urothelial wall thickening and there are no filling defects in the opacified portions of the bilateral collecting systems or ureters.  Stomach/Bowel: PO contrast reaches the large bowel. Stomach is within normal limits. No evidence of bowel wall thickening or dilatation. Few scattered colonic diverticula. Appendix appears normal.  Vascular/Lymphatic: No abdominal aorta or iliac aneurysm. Mild to moderate atherosclerotic plaque of the aorta and its branches. No abdominal, pelvic, or inguinal lymphadenopathy.  Reproductive: Known right oophorectomy. Punctate calcification within the uterus likely related to urine fibroid. Uterus and bilateral adnexa are unremarkable.  Other: No intraperitoneal free fluid. No intraperitoneal free gas. No organized fluid collection.  Musculoskeletal:  No abdominal wall hernia or abnormality.  No suspicious lytic or blastic osseous lesions. No acute displaced fracture. Grade 1 anterolisthesis of L5 on S1.  IMPRESSION: 1. Few scattered colonic diverticula with no acute diverticulitis. 2. No acute intra-abdominal or intrapelvic abnormality. 3. Aortic Atherosclerosis  (ICD10-I70.0).   HISTORY:   Past Medical History:  Diagnosis Date  . Breast cancer ([place]) 06/22/2014  . 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 Osteoporosis   . Skin cancer     Past Surgical History:  Procedure Laterality Date  . CHOLECYSTECTOMY  02/19/2021  . MASTECTOMY Left 12/18/2014  . MASTECTOMY WITH AXILLARY LYMPH NODE DISSECTION Right 12/18/2014    Family History  Problem Relation Age of Onset  . Cancer Mother 38       female cancer - unknown type  . Cancer Father 44       throat cancer; +cig and ETOH  . Cancer Sister 24       breast  . Cancer Brother 38       colorectal  . Cancer Maternal Aunt        three maternal aunts with breast cancer at young ages  . Cancer Paternal Aunt        breast cancer at an unknown age  . Cancer Maternal Grandmother        breast cancer at a young age; stomach cancer at an older age  . Cancer Maternal Grandfather        brain cancer at an older age  . Cancer Paternal Grandmother        bone cancer at an older age  . Cancer Sister 29       melanoma on back    Social History:  reports that she quit smoking about 7 years ago. Her smoking use included cigarettes. She has never used smokeless tobacco. She reports that she does not drink alcohol and does not use drugs.The patient is alone today.  Allergies:  Allergies  Allergen Reactions  . Ciprofloxacin Other (See Comments)    unknown unknown unknown Other reaction(s): Other (See Comments) Unknown reaction  . Codeine Itching  . Levofloxacin     Other reaction(s): Other (See Comments) Unknown reaction    Current Medications: Current Outpatient Medications  Medication Sig Dispense Refill  . albuterol (VENTOLIN HFA) 108 (90 Base) MCG/ACT inhaler Inhale into the lungs.    . ALPRAZolam (XANAX) 0.5 MG tablet Take 1 tablet by mouth as needed.    Marland Kitchen anastrozole (  ARIMIDEX) 1 MG tablet TAKE 1 TABLET EVERY DAY 90 tablet 1  . aspirin 81 MG EC tablet Take  by mouth.    Marland Kitchen atorvastatin (LIPITOR) 10 MG tablet Take 10 mg by mouth daily.     Marland Kitchen buPROPion (WELLBUTRIN XL) 150 MG 24 hr tablet     . calcitonin, salmon, (MIACALCIN/FORTICAL) 200 UNIT/ACT nasal spray     . DROPLET PEN NEEDLES 31G X 8 MM MISC     . FORTEO 620 MCG/2.48ML SOPN     . Multiple Vitamin (MULTIVITAMIN) capsule Take by mouth.    . Niacinamide-Zn-Cu-Methfo-Se-Cr (NICOTINAMIDE PO) Take 500 mg by mouth in the morning and at bedtime.    Marland Kitchen omeprazole (PRILOSEC) 20 MG capsule     . venlafaxine XR (EFFEXOR-XR) 37.5 MG 24 hr capsule Take 37.5 mg by mouth.     No current facility-administered medications for this visit.     ASSESSMENT & PLAN:   Assessment:   1. 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  We recommended continuing anastrozole daily  for total of 10 years due to the stage of her disease as long as her bone density remained fairly stable.  She had mild worsening in the femur neck last year.  2. History of stage IA melanoma of the right breast treated with surgery.  3. 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  4. Osteoporosis. Bone density scan in June 2021 revealed slight worsening bone density in the left femur neck with increased bone density in the spine.  She continues Biomedical engineer. I advised her to add a calcium 637m supplement daily.  5. History of multiple non melanoma skin cancers.  She will continue to follow up with the dermatologist regularly.   Plan:    She knows to continue anastrozole daily.  We will plan to see her back in 1 year with a CBC, comprehensive metabolic panel and bone density scan.  The patient understands the plans discussed today and is in agreement with them.  She knows to contact our office if she develops concerns prior to her next appointment.      KMarvia Pickles PA-C

## 2021-03-26 NOTE — Telephone Encounter (Signed)
Per 6/6 los next appt scheduled and given to patient 

## 2021-06-13 ENCOUNTER — Other Ambulatory Visit: Payer: Self-pay | Admitting: Hematology and Oncology

## 2021-06-13 DIAGNOSIS — Z170 Estrogen receptor positive status [ER+]: Secondary | ICD-10-CM

## 2021-06-13 DIAGNOSIS — C50311 Malignant neoplasm of lower-inner quadrant of right female breast: Secondary | ICD-10-CM

## 2021-09-22 ENCOUNTER — Other Ambulatory Visit: Payer: Self-pay | Admitting: Oncology

## 2022-03-22 ENCOUNTER — Ambulatory Visit: Payer: Medicare HMO

## 2022-03-22 ENCOUNTER — Ambulatory Visit: Payer: Self-pay

## 2022-03-22 ENCOUNTER — Ambulatory Visit (INDEPENDENT_AMBULATORY_CARE_PROVIDER_SITE_OTHER): Payer: Medicare HMO | Admitting: Surgery

## 2022-03-22 ENCOUNTER — Encounter: Payer: Self-pay | Admitting: Surgery

## 2022-03-22 VITALS — BP 132/79 | HR 73 | Ht 60.0 in | Wt 139.8 lb

## 2022-03-22 DIAGNOSIS — G8929 Other chronic pain: Secondary | ICD-10-CM | POA: Diagnosis not present

## 2022-03-22 DIAGNOSIS — M2241 Chondromalacia patellae, right knee: Secondary | ICD-10-CM | POA: Diagnosis not present

## 2022-03-22 DIAGNOSIS — M2242 Chondromalacia patellae, left knee: Secondary | ICD-10-CM

## 2022-03-22 DIAGNOSIS — M6752 Plica syndrome, left knee: Secondary | ICD-10-CM | POA: Diagnosis not present

## 2022-03-22 DIAGNOSIS — M25561 Pain in right knee: Secondary | ICD-10-CM | POA: Diagnosis not present

## 2022-03-22 MED ORDER — BUPIVACAINE HCL 0.25 % IJ SOLN
6.0000 mL | INTRAMUSCULAR | Status: AC | PRN
  Administered 2022-03-22: 6 mL via INTRA_ARTICULAR

## 2022-03-22 MED ORDER — METHYLPREDNISOLONE ACETATE 40 MG/ML IJ SUSP
80.0000 mg | INTRAMUSCULAR | Status: AC | PRN
  Administered 2022-03-22: 80 mg via INTRA_ARTICULAR

## 2022-03-22 MED ORDER — LIDOCAINE HCL 1 % IJ SOLN
3.0000 mL | INTRAMUSCULAR | Status: AC | PRN
  Administered 2022-03-22: 3 mL

## 2022-03-22 NOTE — Progress Notes (Signed)
Office Visit Note   [name]           Date of Birth: [birth year]           MRN: [id number] Visit Date: 03/22/2022              Requested by: Enid Skeens., MD [address],  [place] [address] PCP: Enid Skeens., MD   Assessment & Plan: Visit Diagnoses:  1. Chronic pain of both knees     Plan: Today in hopes to get patient relief offered to inject the more symptomatic left knee.  After patient consent left knee was prepped with Betadine and intra-articular Marcaine/Depo-Medrol injection was performed.  After sitting for few minutes patient reported complete relief of her left knee pain with anesthetic in place.  I will have her follow-up with me in 4 weeks for recheck.  If knee pain returns I may consider getting an MRI.  If her right knee becomes more of an issue we could always form injection there all questions answered.  Follow-Up Instructions: Return in about 4 weeks (around 04/19/2022) for with Takeela Peil recheck left knee after injection.   Orders:  Orders Placed This Encounter  Procedures   XR KNEE 3 VIEW RIGHT   XR KNEE 3 VIEW LEFT   No orders of the defined types were placed in this encounter.     Procedures: Large Joint Inj: L knee on 03/22/2022 10:53 AM Indications: pain Details: 25 G 1.5 in needle, anteromedial approach Medications: 3 mL lidocaine 1 %; 6 mL bupivacaine 0.25 %; 80 mg methylPREDNISolone acetate 40 MG/ML Outcome: tolerated well, no immediate complications  After sitting for few minutes patient reported complete relief of her left knee pain with anesthetic in place. Consent was given by the patient. Patient was prepped and draped in the usual sterile fashion.      Clinical Data: No additional findings.   Subjective: Chief Complaint  Patient presents with   Left Knee - Follow-up   Right Knee - Follow-up    HPI 71 year old white female comes in today with complaints of left greater than right knee pain.   States that she had off-and-on knee pain for several years but nothing bad enough to get evaluation previously.  Currently knee pain worse over the last 2 weeks.  Denies injury.  No mechanical symptoms or feeling of instability.  Pain aggravated when she is squatting, ambulating or going up and down stairs.  Describes pain as being in patellofemoral bilaterally but also has some medial pain on the left.  Has tried ice and oral NSAIDs without any improvement. Review of Systems No current cardiopulmonary GI/GU issues  Objective: Vital Signs: BP 132/79 (BP Location: Left Arm, Patient Position: Sitting, Cuff Size: Normal)   Pulse 73   Ht 5' (1.524 m)   Wt 139 lb 12.8 oz (63.4 kg)   SpO2 97%   BMI 27.30 kg/m   Physical Exam HENT:     Head: Normocephalic and atraumatic.     Nose: Nose normal.  Eyes:     Extraocular Movements: Extraocular movements intact.  Pulmonary:     Effort: No respiratory distress.  Musculoskeletal:     Comments: Gait is somewhat antalgic.  Negative log bilateral hips.  Bilateral knees good range of motion.  No obvious swelling or palpable effusion.  Positive bilateral knee patellofemoral crepitus.  Positive left patellar grind.  Left knee medial plica marked tenderness.  Nontender on the right side.  Left medial joint line tenderness.  Negative McMurray's test.  Ligaments are stable.  Neurological:     Mental Status: She is alert and oriented to person, place, and time.  Psychiatric:        Mood and Affect: Mood normal.    Ortho Exam  Specialty Comments:  No specialty comments available.  Imaging: No results found.   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    Class: History of   Melanoma ([place]) 11/16/2014   Family history of malignant neoplasm of gastrointestinal tract 07/05/2014   Family history of malignant neoplasm of breast 07/05/2014   Malignant neoplasm of lower-inner quadrant of right female breast ([place]) 06/22/2014   Past Medical History:  Diagnosis Date   Breast cancer ([place]) 06/22/2014   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   Osteoporosis    Skin cancer     Family History  Problem Relation Age of Onset   Cancer Mother 50       female cancer - unknown type   Cancer Father 20       throat cancer; +cig and ETOH   Cancer Sister 50       breast   Cancer Brother 25       colorectal   Cancer Maternal Aunt        three maternal aunts with breast cancer at young ages   37 Paternal 64        breast cancer at an unknown age   36 Maternal Grandmother        breast cancer at a young age; stomach cancer at an older age   74 Maternal Grandfather        brain cancer at an older age   55 Paternal Grandmother        bone cancer at an older age   20 Sister 63       melanoma on back    Past Surgical History:  Procedure Laterality Date   CHOLECYSTECTOMY  02/19/2021   MASTECTOMY Left 12/18/2014   MASTECTOMY WITH AXILLARY LYMPH NODE DISSECTION Right 12/18/2014   Social History   Occupational History   Not on file  Tobacco Use   Smoking status: Former    Types: Cigarettes    Quit date: 2015    Years since quitting: 8.4   Smokeless tobacco: Never  Substance and Sexual Activity   Alcohol use: Never   Drug use: Never   Sexual activity: Not Currently

## 2022-04-07 ENCOUNTER — Other Ambulatory Visit: Payer: Self-pay | Admitting: Oncology

## 2022-04-07 DIAGNOSIS — Z170 Estrogen receptor positive status [ER+]: Secondary | ICD-10-CM

## 2022-04-07 NOTE — Progress Notes (Signed)
[facility]  [address] [place],  [place]  [address] [phone]  Clinic Day:  04/08/22  Referring physician: Enid Skeens., MD   CHIEF COMPLAINT:  CC:   Clinical stage III B hormone receptor positive breast cancer  Current Treatment:   Anastrozole 1 mg daily for total of 10 years   HISTORY OF PRESENT ILLNESS:  Vanessa Blackburn is a 71 y.o. 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 She had locally advanced disease in the right medial breast measuring about 3 cm, with involvement of the skin.  Biopsy revealed invasive ductal carcinoma.  Estrogen and progesterone receptors were positive and her 2 Neu negative.  Ki-67 was 14%.  There was no palpable adenopathy, but MRI revealed a 5 mm right axillary node, which was suspicious for metastasis.  PET scan did not show any distant metastasis.  She initially refused chemotherapy, so was placed on neoadjuvant tamoxifen, but had a minimal response to this.  She then developed a melanoma on the upper outer quadrant of the right breast and was seen at [facility].  She underwent bilateral mastectomies.  Pathology of the right breast revealed a 2.8 cm, grade 2, invasive ductal carcinoma with 1/14 nodes positive.  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  Pathology of the skin revealed a 0.4 mm melanoma, Clark's level 2 with a depth of 0.25 mm.  She had a second melanoma removed from the left lateral trunk in March, which was found to be a stage 0 (Tis N0 M0), arising in a dysplastic nevus.  Wide excision revealed no residual melanoma.  The patient was recommended for adjuvant chemotherapy for her breast cancer and was placed on docetaxel/cyclophosphamide in April 2016.  Unfortunately, she developed a severe skin rash requiring IV and oral steroids, felt  to be secondary to docetaxel.  We, therefore, switched her chemotherapy to CMF.  She tolerated this fairly well, but developed acute colitis after a third cycle.  After her 5th cycle, she developed recurrent symptoms of colitis, as well as a progressive rash and weight loss.  Chemotherapy was then discontinued, but we had only planned one more dose of the CMF.  She was then referred for radiation to the right chest wall, which was completed in December 2016.  She was placed on anastrozole 15m daily in January 2017.  Due to her personal family history of malignancy, she underwent testing for hereditary breast and ovarian cancer with the [facility] Breast and Ovarian Cancer panel test.  This did not reveal any clinically significant mutations or variants of uncertain significance.  She had an EGD and colonoscopy in October 2015.  Colonoscopy revealed diverticular disease, but no polyps.  EGD revealed an irregular GE junction.  Biopsy revealed benign squamocolumnar mucosa with chronic inflammation reactive changes, no intestinal metaplasia dysplasia or malignancy were identified.  She underwent impaired esophageal dilatation at that time and was advised to continue omeprazole 20 mg daily.  [facility] reveal any evidence of malignancy or explanation of her abdominal symptoms.  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   Bone density scan in December 2017 revealed worsening osteoporosis with a T-score -3.3 in the spine.  She decided against treatment with denosumab, so was placed on Miacalcin nasal spray in May 2018.  She has had multiple skin cancers but not another melanoma.  Bone density scan in December 2018 revealed persistent osteoporosis with T-score of -3.1 in the spine, which was a 3.7% improvement in the bone density and a T-score of -2 in the femur which was a 0.5%  improvement in bone density.  Miacalcin was therefore continued.  Due to tenderness in the right mastectomy site, the patient underwent a ultrasound of the area in January 2019, which did not reveal any abnormality.  She had back pain and saw an orthopedic surgeon in [place], Dr. Ernestina Patches in August 2019 and was treated with an epidural.   MRI lumbar spine in August 2019 revealed degenerative disc disease with some disc bulging, as well as advanced arthropathy at L5-S1 with stenosis of the canal and foramina causing neural compression.  She is a previous smoker and smoked for about 40 years, but not heavily, reporting about a pack every 3 days, so has an approximately 13 pack year history.  She quit smoking 3 years ago in 2016.  In 2020, she was placed Forteo injections for her osteoporosis, in addition to the [place].   She reported intermittent left chest pain in 2021, for which she had seen Dr. Geraldo Pitter and undergone stress test, which was apparently normal.  She was up-to-date on screening colonoscopy. She continued Forteo and Miacalcin for osteoporosis, as well as a multivitamin with calcium. She was scheduled for back surgery in July.   Due to all her symptoms, she underwent CT chest, abdomen and pelvis, which did not reveal any explanation for her symptoms.  There were subcentimeter lesions within the liver which were stable and consistent with a benign etiology.  There was left colonic diverticulosis without evidence of diverticulitis.  Bone density scan in June 2021 revealed slightly worsened bone density of the left femur neck with a T-score of -2.2, previously -1.9.  There was an increase in the bone density of the spine with a T-score of -2.6, previously -3.1.  We recommended  adding calcium at least daily in addition to her multivitamin.  We discussed alternative treatment for her osteoporosis with alendronate, Reclast, or Prolia, but she declined.  INTERVAL HISTORY:  Vanessa Blackburn is here today for  repeat examination.  She denies any changes in her mastectomy sites. She sees her dermatologist 6/22. She still has chronic pain of the back and arthralgias. She stopped the Forteo as it was not helping, but is still taking calcium.It is 2 years since her last DEXA so we will get that scheduled.  She will continue the miacalcin.. She otherwise denies pain. She denies fevers or chills. Her appetite is good. Her weight has increased 5 pounds in the last year. She is still just taking a multivitamin daily. She had a gallbladder ultrasound in March of 2022, which revealed gallbladder polyps.  CT abdomen and pelvis in April did not reveal any significant abnormality.  She underwent cholecystectomy on May 2nd.  Pathology was benign.  A CBC and comprehensive metabolic profile are normal today.   REVIEW OF SYSTEMS:  Review of Systems  Constitutional:  Negative for appetite change, chills, fatigue, fever and unexpected weight change.  HENT:   Negative for lump/mass, mouth sores and sore throat.   Respiratory:  Negative for cough and shortness of breath.   Cardiovascular:  Negative for chest pain and leg swelling.  Gastrointestinal:  Negative for abdominal pain,  constipation, diarrhea and vomiting. Nausea: Intermittent without vomiting. Endocrine: Negative for hot flashes.  Genitourinary:  Negative for difficulty urinating, dysuria, frequency and hematuria.   Musculoskeletal:  Positive for arthralgias (knee pain). Negative for back pain and myalgias.  Skin:  Negative for rash.  Neurological:  Negative for dizziness and headaches.  Hematological:  Negative for adenopathy. Does not bruise/bleed easily.  Psychiatric/Behavioral:  Negative for depression and sleep disturbance. The patient is not nervous/anxious.    VITALS:  Blood pressure (!) 145/82, pulse 76, temperature 98.3 F (36.8 C), resp. rate 18, height 5' (1.524 m), weight 141 lb 4.8 oz (64.1 kg), SpO2 98 %.  Wt Readings from Last 3 Encounters:   04/08/22 141 lb 4.8 oz (64.1 kg)  03/22/22 139 lb 12.8 oz (63.4 kg)  03/26/21 136 lb 14.4 oz (62.1 kg)    Body mass index is 27.6 kg/m.  Performance status (ECOG): 1 - Symptomatic but completely ambulatory  PHYSICAL EXAM:  Physical Exam Vitals and nursing note reviewed.  Constitutional:      General: She is not in acute distress.    Appearance: Normal appearance.  HENT:     Head: Normocephalic and atraumatic.     Mouth/Throat:     Mouth: Mucous membranes are moist.     Pharynx: Oropharynx is clear. No oropharyngeal exudate or posterior oropharyngeal erythema.  Eyes:     General: No scleral icterus.    Extraocular Movements: Extraocular movements intact.     Conjunctiva/sclera: Conjunctivae normal.     Pupils: Pupils are equal, round, and reactive to light.  Cardiovascular:     Rate and Rhythm: Normal rate and regular rhythm.     Heart sounds: Normal heart sounds. No murmur heard.    No friction rub. No gallop.  Pulmonary:     Effort: Pulmonary effort is normal.     Breath sounds: Normal breath sounds. No wheezing, rhonchi or rales.  Chest:  Breasts:    Right: Absent.     Left: Absent.     Comments: Bilateral mastectomy sites are negative. Abdominal:     General: There is no distension.     Palpations: Abdomen is soft. There is no hepatomegaly, splenomegaly or mass.     Tenderness: There is no abdominal tenderness.  Musculoskeletal:        General: Normal range of motion.     Cervical back: Normal range of motion and neck supple. No tenderness.     Right lower leg: No edema.     Left lower leg: No edema.  Lymphadenopathy:     Cervical: No cervical adenopathy.     Upper Body:     Right upper body: No supraclavicular or axillary adenopathy.     Left upper body: No supraclavicular or axillary adenopathy.     Lower Body: No right inguinal adenopathy. No left inguinal adenopathy.  Skin:    General: Skin is warm and dry.     Coloration: Skin is not jaundiced.      Findings: No rash.     Comments:  No suspicious lesions of the back chest or abdomen  Neurological:     Mental Status: She is alert and oriented to person, place, and time.     Cranial Nerves: No cranial nerve deficit.  Psychiatric:        Mood and Affect: Mood normal.        Behavior: Behavior normal.        Thought Content: Thought content normal.   LABS:  Latest Ref Rng & Units 04/08/2022   12:00 AM  CBC  WBC  9.0      Hemoglobin 12.0 - 16.0 13.3      Hematocrit 36 - 46 39      Platelets 150 - 400 K/uL 264         This result is from an external source.      Latest Ref Rng & Units 04/08/2022   12:00 AM 11/25/2019    3:47 PM  CMP  Glucose 65 - 99 mg/dL  120   BUN 4 - _0 Creatinine 0.5 - 1.1 0.7     0.75   Sodium 137 - 147 141     140   Potassium 3.5 - 5.1 mEq/L 4.0     3.7   Chloride 99 - 108 105     101   CO2 13 - _1 Calcium 8.7 - 10.7 9.3     10.6   Alkaline Phos 25 - 125 62       AST 13 - 35 25       ALT 7 - 35 U/L 22          This result is from an external source.     No results found for: "CEA1", "CEA" / No results found for: "CEA1", "CEA" No results found for: "PSA1" No results found for: "BSW967" No results found for: "CAN125"  No results found for: "TOTALPROTELP", "ALBUMINELP", "A1GS", "A2GS", "BETS", "BETA2SER", "GAMS", "MSPIKE", "SPEI" No results found for: "TIBC", "FERRITIN", "IRONPCTSAT" No results found for: "LDH"  STUDIES:  No results found.   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   Osteoporosis    Skin cancer     Past Surgical History:  Procedure Laterality Date   CHOLECYSTECTOMY  02/19/2021   MASTECTOMY Left 12/18/2014   MASTECTOMY WITH AXILLARY LYMPH NODE DISSECTION Right 12/18/2014    Family History  Problem Relation Age of Onset   Cancer Mother 57       female cancer - unknown type   Cancer Father 61        throat cancer; +cig and ETOH   Cancer Sister 64       breast   Cancer Brother 25       colorectal   Cancer Maternal Aunt        three maternal aunts with breast cancer at young ages   41 Paternal Aunt        breast cancer at an unknown age   32 Maternal Grandmother        breast cancer at a young age; stomach cancer at an older age   84 Maternal Grandfather        brain cancer at an older age   77 Paternal Grandmother        bone cancer at an older age   15 Sister 21       melanoma on back    Social History:  reports that she quit smoking about 8 years ago. Her smoking use included cigarettes. She has never used smokeless tobacco. She reports that she does not drink alcohol and does not use drugs.The patient is alone today.  Allergies:  Allergies  Allergen Reactions   Ciprofloxacin Other (See Comments)    unknown unknown unknown Other reaction(s): Other (  See Comments) Unknown reaction   Codeine Itching   Levofloxacin     Other reaction(s): Other (See Comments) Unknown reaction    Current Medications: Current Outpatient Medications  Medication Sig Dispense Refill   albuterol (VENTOLIN HFA) 108 (90 Base) MCG/ACT inhaler Inhale into the lungs.     ALPRAZolam (XANAX) 0.5 MG tablet Take 1 tablet by mouth as needed.     anastrozole (ARIMIDEX) 1 MG tablet TAKE 1 TABLET EVERY DAY 90 tablet 3   aspirin 81 MG EC tablet Take by mouth.     atorvastatin (LIPITOR) 10 MG tablet Take 10 mg by mouth daily.      buPROPion (WELLBUTRIN XL) 150 MG 24 hr tablet      calcitonin, salmon, (MIACALCIN/FORTICAL) 200 UNIT/ACT nasal spray SPRAY 1 SPRAY ALTERNATE NOSTRIL ONCE DAILY 11.1 mL 5   CEQUA 0.09 % SOLN Apply 1 drop to eye 2 (two) times daily.     DROPLET PEN NEEDLES 31G X 8 MM MISC      FORTEO 620 MCG/2.48ML SOPN      Multiple Vitamin (MULTIVITAMIN) capsule Take by mouth.     Niacinamide-Zn-Cu-Methfo-Se-Cr (NICOTINAMIDE PO) Take 500 mg by mouth in the morning  and at bedtime.     omeprazole (PRILOSEC) 20 MG capsule      RESTASIS 0.05 % ophthalmic emulsion SMARTSIG:1 Drop(s) In Eye(s) Every 12 Hours     TYRVAYA 0.03 MG/ACT SOLN Place into both nostrils.     venlafaxine XR (EFFEXOR-XR) 37.5 MG 24 hr capsule Take 37.5 mg by mouth.     No current facility-administered medications for this visit.     ASSESSMENT & PLAN:   Assessment:   1. 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  We recommend continuing anastrozole daily  for total of 10 years due to the stage of her disease as long as her bone density remained fairly stable.  She had mild worsening in the femur neck in June of 2021 so I will schedule repeat now.  2. History of stage IA melanoma of the right breast treated with surgery.  3. 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  4. Osteoporosis. Bone density scan in June 2021 revealed slight worsening bone density in the left femur neck with increased bone density in the spine.  She continues Miacalcin and calcium 628m supplement daily.  5. History of multiple non melanoma skin cancers.  She will continue to follow up with the dermatologist regularly.   Plan:    She knows to continue anastrozole daily. I will schedule a bone density scan now and call her with the results.  We will plan to see her back in 1 year with a CBC, comprehensive metabolic panel.  The patient understands the plans discussed today and is in agreement with them.  She knows to contact our office if she develops concerns prior to her next appointment.      CDerwood Kaplan MD

## 2022-04-08 ENCOUNTER — Inpatient Hospital Stay: Payer: Medicare HMO

## 2022-04-08 ENCOUNTER — Encounter: Payer: Self-pay | Admitting: Oncology

## 2022-04-08 ENCOUNTER — Telehealth: Payer: Self-pay

## 2022-04-08 ENCOUNTER — Other Ambulatory Visit: Payer: Self-pay

## 2022-04-08 ENCOUNTER — Inpatient Hospital Stay: Payer: Medicare HMO | Attending: Oncology | Admitting: Oncology

## 2022-04-08 ENCOUNTER — Other Ambulatory Visit: Payer: Self-pay | Admitting: Oncology

## 2022-04-08 VITALS — BP 145/82 | HR 76 | Temp 98.3°F | Resp 18 | Ht 60.0 in | Wt 141.3 lb

## 2022-04-08 DIAGNOSIS — C50311 Malignant neoplasm of lower-inner quadrant of right female breast: Secondary | ICD-10-CM

## 2022-04-08 DIAGNOSIS — Z170 Estrogen receptor positive status [ER+]: Secondary | ICD-10-CM | POA: Diagnosis not present

## 2022-04-08 DIAGNOSIS — I890 Lymphedema, not elsewhere classified: Secondary | ICD-10-CM

## 2022-04-08 DIAGNOSIS — C4359 Malignant melanoma of other part of trunk: Secondary | ICD-10-CM | POA: Diagnosis not present

## 2022-04-08 DIAGNOSIS — M810 Age-related osteoporosis without current pathological fracture: Secondary | ICD-10-CM

## 2022-04-08 LAB — HEPATIC FUNCTION PANEL
ALT: 22 U/L (ref 7–35)
AST: 25 (ref 13–35)
Alkaline Phosphatase: 62 (ref 25–125)
Bilirubin, Total: 0.4

## 2022-04-08 LAB — CBC: RBC: 4.15 (ref 3.87–5.11)

## 2022-04-08 LAB — CBC AND DIFFERENTIAL
HCT: 39 (ref 36–46)
Hemoglobin: 13.3 (ref 12.0–16.0)
Neutrophils Absolute: 5.31
Platelets: 264 10*3/uL (ref 150–400)
WBC: 9

## 2022-04-08 LAB — COMPREHENSIVE METABOLIC PANEL
Albumin: 4.2 (ref 3.5–5.0)
Calcium: 9.3 (ref 8.7–10.7)

## 2022-04-08 LAB — BASIC METABOLIC PANEL
BUN: 21 (ref 4–21)
CO2: 28 — AB (ref 13–22)
Chloride: 105 (ref 99–108)
Creatinine: 0.7 (ref 0.5–1.1)
Glucose: 111
Potassium: 4 mEq/L (ref 3.5–5.1)
Sodium: 141 (ref 137–147)

## 2022-04-08 NOTE — Telephone Encounter (Signed)
-----   Message from Derwood Kaplan, MD sent at 04/07/2022  7:05 PM EDT ----- Regarding: DEXA She is due for DEXA this month, not sure if it was ordered or scheduled?

## 2022-04-08 NOTE — Telephone Encounter (Signed)
Patient not scheduled for DEXA that I can see. Langley Gauss can you check on this for me please.

## 2022-04-17 ENCOUNTER — Telehealth: Payer: Self-pay

## 2022-04-17 NOTE — Telephone Encounter (Signed)
-----   Message from Derwood Kaplan, MD sent at 04/08/2022  5:10 PM EDT ----- Regarding: call Tell her rest of labs look good, incl BS 111

## 2022-05-13 ENCOUNTER — Other Ambulatory Visit: Payer: Self-pay

## 2022-07-01 ENCOUNTER — Other Ambulatory Visit: Payer: Self-pay | Admitting: Hematology and Oncology

## 2022-07-01 DIAGNOSIS — Z170 Estrogen receptor positive status [ER+]: Secondary | ICD-10-CM

## 2022-10-03 ENCOUNTER — Other Ambulatory Visit: Payer: Self-pay | Admitting: Oncology

## 2023-04-09 ENCOUNTER — Other Ambulatory Visit: Payer: Self-pay | Admitting: Oncology

## 2023-04-09 ENCOUNTER — Inpatient Hospital Stay: Payer: Medicare HMO | Attending: Oncology | Admitting: Oncology

## 2023-04-09 ENCOUNTER — Inpatient Hospital Stay: Payer: Medicare HMO

## 2023-04-09 VITALS — BP 138/78 | HR 72 | Temp 98.2°F | Resp 19 | Ht 60.0 in | Wt 153.1 lb

## 2023-04-09 DIAGNOSIS — C50311 Malignant neoplasm of lower-inner quadrant of right female breast: Secondary | ICD-10-CM

## 2023-04-09 DIAGNOSIS — Z923 Personal history of irradiation: Secondary | ICD-10-CM | POA: Insufficient documentation

## 2023-04-09 DIAGNOSIS — C50911 Malignant neoplasm of unspecified site of right female breast: Secondary | ICD-10-CM | POA: Insufficient documentation

## 2023-04-09 DIAGNOSIS — Z9013 Acquired absence of bilateral breasts and nipples: Secondary | ICD-10-CM | POA: Insufficient documentation

## 2023-04-09 DIAGNOSIS — C4359 Malignant melanoma of other part of trunk: Secondary | ICD-10-CM | POA: Diagnosis not present

## 2023-04-09 DIAGNOSIS — M549 Dorsalgia, unspecified: Secondary | ICD-10-CM | POA: Insufficient documentation

## 2023-04-09 DIAGNOSIS — M810 Age-related osteoporosis without current pathological fracture: Secondary | ICD-10-CM

## 2023-04-09 DIAGNOSIS — Z170 Estrogen receptor positive status [ER+]: Secondary | ICD-10-CM | POA: Insufficient documentation

## 2023-04-09 DIAGNOSIS — R21 Rash and other nonspecific skin eruption: Secondary | ICD-10-CM | POA: Insufficient documentation

## 2023-04-09 DIAGNOSIS — D0352 Melanoma in situ of breast (skin) (soft tissue): Secondary | ICD-10-CM

## 2023-04-09 LAB — CBC WITH DIFFERENTIAL (CANCER CENTER ONLY)
Abs Immature Granulocytes: 0.03 10*3/uL (ref 0.00–0.07)
Basophils Absolute: 0.1 10*3/uL (ref 0.0–0.1)
Basophils Relative: 2 %
Eosinophils Absolute: 0.2 10*3/uL (ref 0.0–0.5)
Eosinophils Relative: 2 %
HCT: 38 % (ref 36.0–46.0)
Hemoglobin: 12.5 g/dL (ref 12.0–15.0)
Immature Granulocytes: 0 %
Lymphocytes Relative: 36 %
Lymphs Abs: 2.9 10*3/uL (ref 0.7–4.0)
MCH: 31.6 pg (ref 26.0–34.0)
MCHC: 32.9 g/dL (ref 30.0–36.0)
MCV: 96.2 fL (ref 80.0–100.0)
Monocytes Absolute: 0.6 10*3/uL (ref 0.1–1.0)
Monocytes Relative: 8 %
Neutro Abs: 4.2 10*3/uL (ref 1.7–7.7)
Neutrophils Relative %: 52 %
Platelet Count: 258 10*3/uL (ref 150–400)
RBC: 3.95 MIL/uL (ref 3.87–5.11)
RDW: 12.1 % (ref 11.5–15.5)
WBC Count: 8 10*3/uL (ref 4.0–10.5)
nRBC: 0 % (ref 0.0–0.2)

## 2023-04-09 LAB — CMP (CANCER CENTER ONLY)
ALT: 24 U/L (ref 0–44)
AST: 21 U/L (ref 15–41)
Albumin: 3.9 g/dL (ref 3.5–5.0)
Alkaline Phosphatase: 81 U/L (ref 38–126)
Anion gap: 6 (ref 5–15)
BUN: 21 mg/dL (ref 8–23)
CO2: 26 mmol/L (ref 22–32)
Calcium: 9 mg/dL (ref 8.9–10.3)
Chloride: 108 mmol/L (ref 98–111)
Creatinine: 0.83 mg/dL (ref 0.44–1.00)
GFR, Estimated: >60 mL/min (ref >60)
Glucose, Bld: 122 mg/dL — ABNORMAL HIGH (ref 70–99)
Potassium: 3.9 mmol/L (ref 3.5–5.1)
Sodium: 140 mmol/L (ref 135–145)
Total Bilirubin: 0.4 mg/dL (ref 0.3–1.2)
Total Protein: 6.7 g/dL (ref 6.5–8.1)

## 2023-04-09 MED ORDER — ANASTROZOLE 1 MG PO TABS: 1.0000 mg | ORAL_TABLET | Freq: Every day | ORAL | 3 refills | Status: DC

## 2023-04-09 NOTE — Progress Notes (Signed)
[facility]  [address] [place],  [place]  [address] [phone]  Clinic Day:  04/09/23  Referring physician: Nonnie Done., MD   CHIEF COMPLAINT:  CC:   Clinical stage III B hormone receptor positive breast cancer  Current Treatment:   Anastrozole 1 mg daily for total of 10 years   HISTORY OF PRESENT ILLNESS:  Vanessa Blackburn is a 72 y.o. 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 She had locally advanced disease in the right medial breast measuring about 3 cm, with involvement of the skin.  Biopsy revealed invasive ductal carcinoma.  Estrogen and progesterone receptors were positive and her 2 Neu negative.  Ki-67 was 14%.  There was no palpable adenopathy, but MRI revealed a 5 mm right axillary node, which was suspicious for metastasis.  PET scan did not show any distant metastasis.  She initially refused chemotherapy, so was placed on neoadjuvant tamoxifen, but had a minimal response to this.  She then developed a melanoma on the upper outer quadrant of the right breast and was seen at [facility].  She underwent bilateral mastectomies.  Pathology of the right breast revealed a 2.8 cm, grade 2, invasive ductal carcinoma with 1/14 nodes positive.  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  Pathology of the skin revealed a 0.4 mm melanoma, Clark's level 2 with a depth of 0.25 mm.  She had a second melanoma removed from the left lateral trunk in March, which was found to be a stage 0 (Tis N0 M0), arising in a dysplastic nevus.  Wide excision revealed no residual melanoma.  The patient was recommended for adjuvant chemotherapy for her breast cancer and was placed on docetaxel/cyclophosphamide in April 2016.  Unfortunately, she developed a severe skin rash requiring IV and oral steroids, felt  to be secondary to docetaxel.  We, therefore, switched her chemotherapy to CMF.  She tolerated this fairly well, but developed acute colitis after a third cycle.  After her 5th cycle, she developed recurrent symptoms of colitis, as well as a progressive rash and weight loss.  Chemotherapy was then discontinued, but we had only planned one more dose of the CMF.  She was then referred for radiation to the right chest wall, which was completed in December 2016.  She was placed on anastrozole 1mg  daily in January 2017.  Due to her personal family history of malignancy, she underwent testing for hereditary breast and ovarian cancer with the [facility] Breast and Ovarian Cancer panel test.  This did not reveal any clinically significant mutations or variants of uncertain significance.  She had an EGD and colonoscopy in October 2015.  Colonoscopy revealed diverticular disease, but no polyps.  EGD revealed an irregular GE junction.  Biopsy revealed benign squamocolumnar mucosa with chronic inflammation reactive changes, no intestinal metaplasia dysplasia or malignancy were identified.  She underwent impaired esophageal dilatation at that time and was advised to continue omeprazole 20 mg daily.  CT chest, abdomen and pelvis at [facility] in September 2017 did not reveal any evidence of malignancy or explanation of her abdominal symptoms.  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   Bone density scan in December 2017 revealed worsening osteoporosis with a T-score -3.3 in the spine.  She decided against treatment with denosumab, so was placed on Miacalcin nasal spray in May 2018.  She has had multiple skin cancers but not another melanoma. Bone density scan in December 2018 revealed persistent osteoporosis with T-score of -3.1 in the spine, which was a 3.7% improvement in the bone density and a T-score of -2 in the femur which was a 0.5%  improvement in bone density.  Miacalcin was therefore continued.  Due to tenderness in the right mastectomy site, the patient underwent a ultrasound of the area in January 2019, which did not reveal any abnormality.  She had back pain and saw an orthopedic surgeon in [place], Dr. Alvester Morin in August 2019 and was treated with an epidural.   MRI lumbar spine in August 2019 revealed degenerative disc disease with some disc bulging, as well as advanced arthropathy at L5-S1 with stenosis of the canal and foramina causing neural compression.  She is a previous smoker and smoked for about 40 years, but not heavily, reporting about a pack every 3 days, so has an approximately 13 pack year history.  She quit smoking 3 years ago in 2016.  In 2020, she was placed Forteo injections for her osteoporosis, in addition to the Miacalcin.   She reported intermittent left chest pain in 2021, for which she had seen Dr. Tomie China and undergone stress test, which was apparently normal.  She was up-to-date on screening colonoscopy. She continued Forteo and Miacalcin for osteoporosis, as well as a multivitamin with calcium. She was scheduled for back surgery in July.   Due to all her symptoms, she underwent CT chest, abdomen and pelvis, which did not reveal any explanation for her symptoms.  There were subcentimeter lesions within the liver which were stable and consistent with a benign etiology.  There was left colonic diverticulosis without evidence of diverticulitis.  Bone density scan in June 2021 revealed slightly worsened bone density of the left femur neck with a T-score of -2.2, previously -1.9.  There was an increase in the bone density of the spine with a T-score of -2.6, previously -3.1.  We recommended  adding calcium at least daily in addition to her multivitamin.  We discussed alternative treatment for her osteoporosis with alendronate, Reclast, or Prolia, but she declined. She underwent cholecystectomy on May  2nd.  INTERVAL HISTORY:  Vanessa Blackburn is here today for repeat examination for clinical stage III B hormone receptor positive breast cancer diagnosed September, 2015. In 2016, she also had resection of a stage I melanoma of the right breast and a stage 0 melanoma of the skin of the left trunk. Patient states that She feels well but her stomach feels bloated., but she has been on prednisone recently. She had an MRI of the spine done due to chronic back pain and now she is on chronic pain management. She says she gained 20 pounds and it made the back pains worse. She has tried to stay very active. She had pneumonia and bronchitis in the past and she was placed on antibiotics and prednisone. She had shingles in her right back side some months ago. She was started on Anastrozole in January, 2017. She says she is taking a supplement for her bones, but refuses further injections because "they didn't work".  I scheduled a bone density scan today. Her labs are pending today and I will call her with the results. I will see her back in 1 year with CBC and CMP.  She  denies signs of infections such as  sore throat, sinus drainage, cough or urinary symptoms. She  denies fever or recurrent chills. She  also deny nausea, vomiting, chest pain dyspnea or cough. Her  appetite is good and Her  weight has increased 12 pounds over last 1 year.    REVIEW OF SYSTEMS:  Review of Systems  Constitutional:  Negative for appetite change, chills, diaphoresis, fatigue, fever and unexpected weight change.  HENT:   Negative for hearing loss, lump/mass, mouth sores, nosebleeds, sore throat, tinnitus, trouble swallowing and voice change.   Respiratory:  Negative for chest tightness, cough, hemoptysis, shortness of breath and wheezing.   Cardiovascular:  Negative for chest pain and leg swelling.  Gastrointestinal:  Positive for abdominal distention. Negative for abdominal pain, blood in stool, constipation, diarrhea, nausea (Intermittent without  vomiting), rectal pain and vomiting.  Endocrine: Negative for hot flashes.  Genitourinary:  Negative for bladder incontinence, difficulty urinating, dyspareunia, dysuria, frequency, hematuria, menstrual problem, nocturia, pelvic pain, vaginal bleeding and vaginal discharge.   Musculoskeletal:  Positive for arthralgias (knee pain) and back pain. Negative for flank pain, gait problem, myalgias, neck pain and neck stiffness.  Skin:  Negative for itching, rash and wound.  Neurological:  Negative for dizziness, extremity weakness, gait problem, headaches, light-headedness, numbness, seizures and speech difficulty.  Hematological:  Negative for adenopathy. Does not bruise/bleed easily.  Psychiatric/Behavioral:  Negative for depression and sleep disturbance. The patient is not nervous/anxious.    VITALS:  Blood pressure 138/78, pulse 72, temperature 98.2 F (36.8 C), temperature source Oral, resp. rate 19, height 5' (1.524 m), weight 153 lb 1.6 oz (69.4 kg), SpO2 97 %.  Wt Readings from Last 3 Encounters:  04/09/23 153 lb 1.6 oz (69.4 kg)  04/08/22 141 lb 4.8 oz (64.1 kg)  03/22/22 139 lb 12.8 oz (63.4 kg)    Body mass index is 29.9 kg/m.  Performance status (ECOG): 1 - Symptomatic but completely ambulatory  PHYSICAL EXAM:  Physical Exam Vitals and nursing note reviewed.  Constitutional:      General: She is not in acute distress.    Appearance: Normal appearance.  HENT:     Head: Normocephalic and atraumatic.     Mouth/Throat:     Mouth: Mucous membranes are moist.     Pharynx: Oropharynx is clear. No oropharyngeal exudate or posterior oropharyngeal erythema.  Eyes:     General: No scleral icterus.    Extraocular Movements: Extraocular movements intact.     Conjunctiva/sclera: Conjunctivae normal.     Pupils: Pupils are equal, round, and reactive to light.  Cardiovascular:     Rate and Rhythm: Normal rate and regular rhythm.     Heart sounds: Normal heart sounds. No murmur heard.     No friction rub. No gallop.  Pulmonary:     Effort: Pulmonary effort is normal.     Breath sounds: Normal breath sounds. No wheezing, rhonchi or rales.  Chest:  Breasts:    Right: Absent.     Left: Absent.     Comments: Bilateral mastectomy sites are negative. She has a lumpy area above the medial right incision which feel benign Abdominal:     General: There is no distension.     Palpations: Abdomen is soft. There is no hepatomegaly, splenomegaly or mass.     Tenderness: There is no abdominal tenderness.  Musculoskeletal:        General: Normal range of motion.     Cervical back: Normal range of motion and neck supple. No tenderness.  Right lower leg: No edema.     Left lower leg: No edema.  Lymphadenopathy:     Cervical: No cervical adenopathy.     Upper Body:     Right upper body: No supraclavicular or axillary adenopathy.     Left upper body: No supraclavicular or axillary adenopathy.     Lower Body: No right inguinal adenopathy. No left inguinal adenopathy.  Skin:    General: Skin is warm and dry.     Coloration: Skin is not jaundiced.     Findings: No rash.     Comments:  No suspicious lesions of the back chest or abdomen  Neurological:     Mental Status: She is alert and oriented to person, place, and time.     Cranial Nerves: No cranial nerve deficit.  Psychiatric:        Mood and Affect: Mood normal.        Behavior: Behavior normal.        Thought Content: Thought content normal.    LABS:      Latest Ref Rng & Units 04/09/2023    9:33 AM 04/08/2022   12:00 AM  CBC  WBC 4.0 - 10.5 K/uL 8.0  9.0      Hemoglobin 12.0 - 15.0 g/dL 40.9  81.1      Hematocrit 36.0 - 46.0 % 38.0  39      Platelets 150 - 400 K/uL 258  264         This result is from an external source.      Latest Ref Rng & Units 04/09/2023    9:33 AM 04/08/2022   12:00 AM 11/25/2019    3:47 PM  CMP  Glucose 70 - 99 mg/dL 914   782   BUN 8 - 23 mg/dL 21  21     11    Creatinine 0.44 - 1.00  mg/dL 9.56  0.7     2.13   Sodium 135 - 145 mmol/L 140  141     140   Potassium 3.5 - 5.1 mmol/L 3.9  4.0     3.7   Chloride 98 - 111 mmol/L 108  105     101   CO2 22 - 32 mmol/L 26  28     26    Calcium 8.9 - 10.3 mg/dL 9.0  9.3     08.6   Total Protein 6.5 - 8.1 g/dL 6.7     Total Bilirubin 0.3 - 1.2 mg/dL 0.4     Alkaline Phos 38 - 126 U/L 81  62       AST 15 - 41 U/L 21  25       ALT 0 - 44 U/L 24  22          This result is from an external source.     No results found for: "CEA1", "CEA" / No results found for: "CEA1", "CEA" No results found for: "PSA1" No results found for: "VHQ469" No results found for: "CAN125"  No results found for: "TOTALPROTELP", "ALBUMINELP", "A1GS", "A2GS", "BETS", "BETA2SER", "GAMS", "MSPIKE", "SPEI" No results found for: "TIBC", "FERRITIN", "IRONPCTSAT" No results found for: "LDH"  STUDIES:  No results found.   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   Osteoporosis    Skin cancer     Past Surgical History:  Procedure Laterality Date   CHOLECYSTECTOMY  02/19/2021  MASTECTOMY Left 12/18/2014   MASTECTOMY WITH AXILLARY LYMPH NODE DISSECTION Right 12/18/2014    Family History  Problem Relation Age of Onset   Cancer Mother 56       female cancer - unknown type   Cancer Father 34       throat cancer; +cig and ETOH   Cancer Sister 39       breast   Cancer Brother 25       colorectal   Cancer Maternal Aunt        three maternal aunts with breast cancer at young ages   Cancer Paternal Aunt        breast cancer at an unknown age   Cancer Maternal Grandmother        breast cancer at a young age; stomach cancer at an older age   Cancer Maternal Grandfather        brain cancer at an older age   Cancer Paternal Grandmother        bone cancer at an older age   Cancer Sister 41       melanoma on back    Social History:  reports that she  quit smoking about 9 years ago. Her smoking use included cigarettes. She has never used smokeless tobacco. She reports that she does not drink alcohol and does not use drugs.The patient is alone today.  Allergies:  Allergies  Allergen Reactions   Ciprofloxacin Other (See Comments)    unknown  Other reaction(s): Other (See Comments)  Unknown reaction   Codeine Itching   Levofloxacin Other (See Comments)    Other reaction(s): Other (See Comments)  Unknown reaction    Current Medications: Current Outpatient Medications  Medication Sig Dispense Refill   diclofenac (VOLTAREN) 75 MG EC tablet Take 75 mg by mouth 2 (two) times daily.     NALOCET 2.5-300 MG per tablet Take by mouth.     omeprazole (PRILOSEC) 40 MG capsule Take 40 mg by mouth daily.     pregabalin (LYRICA) 150 MG capsule Take 150 mg by mouth 2 (two) times daily.     albuterol (VENTOLIN HFA) 108 (90 Base) MCG/ACT inhaler Inhale into the lungs.     ALPRAZolam (XANAX) 0.5 MG tablet Take 1 tablet by mouth as needed.     anastrozole (ARIMIDEX) 1 MG tablet Take 1 tablet (1 mg total) by mouth daily. 90 tablet 3   aspirin 81 MG EC tablet Take by mouth.     atorvastatin (LIPITOR) 10 MG tablet Take 10 mg by mouth daily.      buPROPion (WELLBUTRIN XL) 150 MG 24 hr tablet      calcitonin, salmon, (MIACALCIN/FORTICAL) 200 UNIT/ACT nasal spray SPRAY 1 SPRAY ALTERNATE NOSTRIL ONCE DAILY 11.1 mL 5   CEQUA 0.09 % SOLN Apply 1 drop to eye 2 (two) times daily.     DROPLET PEN NEEDLES 31G X 8 MM MISC      Multiple Vitamin (MULTIVITAMIN) capsule Take by mouth.     Niacinamide-Zn-Cu-Methfo-Se-Cr (NICOTINAMIDE PO) Take 500 mg by mouth in the morning and at bedtime.     TYRVAYA 0.03 MG/ACT SOLN Place into both nostrils.     venlafaxine XR (EFFEXOR-XR) 37.5 MG 24 hr capsule Take 37.5 mg by mouth.     No current facility-administered medications for this visit.     ASSESSMENT & PLAN:   Assessment:   1. History of stage IIIB hormone  receptor positive right breast cancer treated with surgery, chemotherapy and radiation.  We recommend continuing anastrozole daily  for total of 10 years due to the stage of her disease as long as her bone density remained fairly stable.  She had mild worsening in the femur neck in June of 2021 but improvement in the spine, so I will schedule repeat now.  2. History of stage IA melanoma of the right breast treated with surgery.  3. 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  4. Osteoporosis. Bone density scan in June 2021 revealed slight worsening bone density in the left femur neck with increased bone density in the spine.  She continues Miacalcin and calcium 600mg  supplement daily.  5. History of multiple non melanoma skin cancers.  She will continue to follow up with the dermatologist regularly.   Plan:   She had an MRI of the spine done due to chronic back pain and now she is on chronic pain management. She says she gained 20 pounds and it made the back pains worse. She has tried to stay very active. She had pneumonia and bronchitis in the past and she was placed on antibiotics and prednisone. I feel some of her abdominal bloating may be related to the steroids. She had shingles in her right back side some months ago. She was started on Anastrozole in January, 2017. She knows to continue anastrozole daily. She says she is taking a supplement for her bones, but refuses further injections because "they didn't work".  I scheduled a bone density scan today. Her labs are pending today and I will call her with the results. I will see her back in 1 year with CBC and CMP. The patient understands the plans discussed today and is in agreement with them.  She knows to contact our office if she develops concerns prior to her next appointment.   I provided 25 minutes of face-to-face time during this this encounter and > 50% was spent counseling as documented under my assessment and  plan.    Dellia Beckwith, MD    I,Oluwatobi Asade,acting as a scribe for Dellia Beckwith, MD.,have documented all relevant documentation on the behalf of Dellia Beckwith, MD,as directed by  Dellia Beckwith, MD while in the presence of Dellia Beckwith, MD.

## 2023-04-11 ENCOUNTER — Telehealth: Payer: Self-pay

## 2023-04-11 NOTE — Telephone Encounter (Signed)
Called patient and notified her of lab results. 

## 2023-04-11 NOTE — Telephone Encounter (Signed)
-----   Message from Dellia Beckwith, MD sent at 04/11/2023 10:31 AM EDT ----- Regarding: call Tell her labs look great

## 2023-04-17 ENCOUNTER — Encounter: Payer: Self-pay | Admitting: Oncology

## 2023-04-22 ENCOUNTER — Ambulatory Visit (INDEPENDENT_AMBULATORY_CARE_PROVIDER_SITE_OTHER): Payer: Medicare HMO | Admitting: Podiatry

## 2023-04-22 DIAGNOSIS — Z91199 Patient's noncompliance with other medical treatment and regimen due to unspecified reason: Secondary | ICD-10-CM

## 2023-04-22 NOTE — Progress Notes (Signed)
NS for apt, [place]

## 2023-05-05 ENCOUNTER — Ambulatory Visit (INDEPENDENT_AMBULATORY_CARE_PROVIDER_SITE_OTHER): Payer: Medicare HMO | Admitting: Podiatry

## 2023-05-05 ENCOUNTER — Ambulatory Visit (INDEPENDENT_AMBULATORY_CARE_PROVIDER_SITE_OTHER): Payer: Medicare HMO

## 2023-05-05 DIAGNOSIS — M19072 Primary osteoarthritis, left ankle and foot: Secondary | ICD-10-CM | POA: Diagnosis not present

## 2023-05-05 DIAGNOSIS — M19071 Primary osteoarthritis, right ankle and foot: Secondary | ICD-10-CM | POA: Diagnosis not present

## 2023-05-05 DIAGNOSIS — M25572 Pain in left ankle and joints of left foot: Secondary | ICD-10-CM

## 2023-05-05 DIAGNOSIS — M25371 Other instability, right ankle: Secondary | ICD-10-CM | POA: Diagnosis not present

## 2023-05-05 DIAGNOSIS — M25571 Pain in right ankle and joints of right foot: Secondary | ICD-10-CM

## 2023-05-05 NOTE — Progress Notes (Signed)
  Subjective:  Patient ID: Vanessa Blackburn, female    DOB: 04-09-1951,  MRN: [id number]   72 y.o. female presents with concern for pain in the bilateral ankles.  She says she has pain when she walks primarily.  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 Nails are of normal length thickness and coloration.   Vascular:  Dorsalis Pedis artery and Posterior Tibial artery pedal pulses are 2/4 bilateral.  Capillary fill time < 3 sec to all digits.   Neruologic: Grossly intact via light touch bilateral. Protective threshold intact to all sites bilateral.   Musculoskeletal: Pain with palpation along the anterior aspect of the tibiotalar joint bilateral ankle.  There is noted to be instability of the right ankle increased inversion eversion range of motion.  Gait: Unassisted, Nonantalgic.   No images are attached to the encounter.  Radiographs:  Date: 05/05/2023 XR bilateral foot weightbearing AP/Lateral/Oblique   Findings: Attention directed to the bilateral tibiotalar joint there is noted to be decreased joint space subchondral sclerosis without evidence of osteochondral lesion or  fracture overall consistent with primary osteoarthritis of the bilateral tibiotalar joint Assessment:   1. Arthritis of both ankles   2. Ankle instability, right   3. Acute bilateral ankle pain      Plan:  Patient was evaluated and treated and all questions answered.  # Bilateral ankle osteoarthritis primary -Discussed with patient that she does have evidence of bilateral ankle osteoarthritis likely primary in nature -Discussed use of anti-inflammatory medications bracing icing for arthritic pain -Recommend steroid injection.  Patient was agreeable.  After sterile Prep injected 1 cc half percent Marcaine plain with 1 cc Kenalog 10 into the anterior lateral aspect of the tibiotalar joint on bilateral ankle.  # Right ankle instability -Recommend use of a Tri-Lock ankle brace for the right ankle. Recommend stable shoes -Dispensed Tri-Lock at this visit  Return in about 6 weeks (around 06/16/2023) for f/u ankle arthritis.          Corinna Gab, DPM [facility]

## 2023-05-16 ENCOUNTER — Telehealth: Payer: Self-pay

## 2023-05-16 NOTE — Telephone Encounter (Signed)
Patient called wanting to know if it ok for her to take [facility] supplement to help with her memory loss.

## 2023-05-20 NOTE — Telephone Encounter (Signed)
Patient notified of message

## 2023-06-04 ENCOUNTER — Ambulatory Visit (INDEPENDENT_AMBULATORY_CARE_PROVIDER_SITE_OTHER): Payer: Medicare HMO | Admitting: Podiatry

## 2023-06-04 DIAGNOSIS — M19071 Primary osteoarthritis, right ankle and foot: Secondary | ICD-10-CM | POA: Diagnosis not present

## 2023-06-04 DIAGNOSIS — M25371 Other instability, right ankle: Secondary | ICD-10-CM | POA: Diagnosis not present

## 2023-06-04 DIAGNOSIS — M19072 Primary osteoarthritis, left ankle and foot: Secondary | ICD-10-CM | POA: Diagnosis not present

## 2023-06-04 NOTE — Progress Notes (Signed)
Chief Complaint  Patient presents with   Ankle Pain    LEFT ANKLE IS BETTER, RIGHT ANKLE IS "HORRIBLE" INJECTION MADE IT WORSE, KEEPING IT ELEVATED AND ICED. PAIN LEVEL 8/10   HPI: 72 y.o. female presents today for follow-up of bilateral ankle pain.  Dr. Annamary Rummage did bilateral cortisone injections on 05/05/2023.  The left ankle is much better.  The right is actually worse.  She refuses another cortisone injection for this reason.  She is not sure whether the Tri-Lock ankle brace is fitting correctly.  She states it feels better when she puts it on versus when a staff member puts it on.  She notes that she has some chronic issues with her lower back/radiculopathy and sometimes when walking, her legs will just give out and she will fall.  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  General: The patient is alert and oriented x3 in no acute distress.  Dermatology: Skin is warm, dry and supple bilateral lower extremities.   Vascular: Palpable pedal pulses bilaterally. Capillary refill within normal limits.  Mild localized edema around the right  ankle.  No ecchymosis or erythema or calor is noted  Neurological: Light touch sensation grossly intact bilateral feet.   Musculoskeletal Exam: Pain on palpation to the right ankle, the anterolateral ankle and medial ankle.  Pain with range of motion of the ankle joint.  No crepitus with ankle range of motion.  Pain with forced inversion of the hindfoot.  Assessment/Plan of Care: 1. Arthritis of both ankles   2. Ankle instability, right     AMB REFERRAL TO PHYSICAL THERAPY  Discussed clinical findings with patient today.  We will hold off on a second cortisone injection to the right ankle due to her significant pain that began after receiving the cortisone injection even though her left ankle did very well.  Patient was open to trying physical therapy.  A written prescription was provided for [facility] health physical therapy in [place].  She can call and set up her appointment at her convenience.  The order was placed in the system for reference only  If the patient does not continue to improve with physical therapy, she may be a good candidate for an AFO on the right ankle.  Unless physical therapy has other recommendations, an [place] AFO is most likely the best supportive device to recommend for her.   Clerance Lav, DPM, FACFAS [facility]     [address]  [place], [place] [address]                Office [phone]  Fax [phone]

## 2023-06-16 ENCOUNTER — Ambulatory Visit: Payer: Medicare HMO | Admitting: Podiatry

## 2023-07-09 ENCOUNTER — Encounter: Payer: Self-pay | Admitting: Oncology

## 2023-07-16 ENCOUNTER — Ambulatory Visit (INDEPENDENT_AMBULATORY_CARE_PROVIDER_SITE_OTHER): Payer: Medicare HMO | Admitting: Podiatry

## 2023-07-16 DIAGNOSIS — M19071 Primary osteoarthritis, right ankle and foot: Secondary | ICD-10-CM | POA: Diagnosis not present

## 2023-07-16 DIAGNOSIS — M19072 Primary osteoarthritis, left ankle and foot: Secondary | ICD-10-CM

## 2023-07-16 NOTE — Progress Notes (Signed)
  Chief Complaint  Patient presents with   Follow-up    6 weeks f/u after physical therapy for right ankle pain. Patient right ankle is doing much better. She is having some throbbing pain to the lateral aspect of right foot radiating to the top of foot. She continues to do PT.    HPI: 72 y.o. female presents today for follow-up of ankle arthralgia.  She has been going to physical therapy at [facility].  Overall she states that she is doing better.  She does not recall any e-stim or ultrasound being performed.  She states that she cannot tolerate any shoes with a thicker sole so she prefers to wear a moccasin style shoe with no support or cushioning.  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  There is minimal discomfort on the tarsometatarsal joints on the right.  Palpable pedal pulses noted.   Assessment/Plan of Care: 1. Arthritis of both ankles     Discussed clinical findings with patient today.  Continue with physical therapy.  Note written to have PT call us we can get any modalities added as necessary.  Follow-up 6 weeks   Raydon Chappuis DBurna Mortimer, DPM, FACFAS [facility]     [address] [place], [place] [address]                Office [phone]  Fax [phone]

## 2023-08-27 ENCOUNTER — Encounter: Payer: Medicare HMO | Admitting: Podiatry

## 2023-08-27 NOTE — Progress Notes (Signed)
Patient did not show for her scheduled f/u appointment today.

## 2023-10-06 ENCOUNTER — Encounter: Payer: Self-pay | Admitting: Podiatry

## 2023-10-06 ENCOUNTER — Ambulatory Visit (INDEPENDENT_AMBULATORY_CARE_PROVIDER_SITE_OTHER): Payer: Medicare HMO | Admitting: Podiatry

## 2023-10-06 ENCOUNTER — Ambulatory Visit (INDEPENDENT_AMBULATORY_CARE_PROVIDER_SITE_OTHER): Payer: Medicare HMO

## 2023-10-06 DIAGNOSIS — M19071 Primary osteoarthritis, right ankle and foot: Secondary | ICD-10-CM | POA: Diagnosis not present

## 2023-10-06 DIAGNOSIS — M7751 Other enthesopathy of right foot: Secondary | ICD-10-CM | POA: Diagnosis not present

## 2023-10-06 DIAGNOSIS — M7750 Other enthesopathy of unspecified foot: Secondary | ICD-10-CM | POA: Diagnosis not present

## 2023-10-06 MED ORDER — PREDNISONE 10 MG PO TABS: ORAL_TABLET | ORAL | 0 refills | Status: AC

## 2023-10-06 NOTE — Patient Instructions (Signed)
I have ordered your MRI through [facility] on [address]. If you do not hear for them about scheduling within the next 1 week, or you have any questions please give [place] a call at [phone].    Keep right ankle immobilized using your cam boot or ankle brace that you received at previous visits.  Try to limit excessive weightbearing activity whenever possible.  Take steroid taper as directed.

## 2023-10-08 ENCOUNTER — Other Ambulatory Visit: Payer: Self-pay | Admitting: Oncology

## 2023-10-08 NOTE — Progress Notes (Signed)
Chief Complaint  Patient presents with   Foot Pain    RT foot and ankle pain, unable to put weight on it.  Issue for a couple of years, pain has worsened.  2-3 years fractured foot.     HPI: 72 y.o. female presents today for follow-up evaluation of right ankle pain.  She had been doing better for a period of time after some physical therapy.  She reports that is worsened again over the past couple weeks and that she has had difficulty putting weight on the right lower extremity especially and reports pain with flexing the foot up.  She does report that she fell in the bathtub a couple days ago, unsure of she aggravated the ankle.  Reports most of her discomfort to the anterior medial aspect.  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  General: The patient is alert and oriented x3 in no acute distress.  Dermatology: Skin is warm, dry and supple bilateral lower extremities. Interspaces are clear of maceration and debris.    Vascular: Palpable pedal pulses bilaterally. Capillary refill within normal limits.  No appreciable edema.  No erythema or calor.  Neurological: Light touch sensation grossly intact bilateral feet.  Negative Tinel's sign  Musculoskeletal Exam: Pain on palpation of the anterior extensor  tendons and medial ankle soft tissue structures including the deltoid ligament and PT tendon.  Pain on palpation of anterior ankle joint capsule.  Pain with resisted dorsiflexion with, muscle strength 4/5 in dorsiflexion and inversion.  Decreased right ankle joint dorsiflexion noted.  Radiographic Exam: 10/06/2023 right ankle radiographs Normal osseous mineralization.  No fractures or acute osseous abnormalities appreciated.  Decrease ankle joint space appreciated.  Spurring present to anterior tibia  Assessment/Plan of Care: 1. Capsulitis of right ankle   2. Arthritis of right ankle   3. Ankle tendinitis      Meds ordered this encounter  Medications   predniSONE (DELTASONE) 10 MG tablet    Sig: Take 6 tablets (60 mg total) by mouth daily with breakfast for 2 days, THEN 5 tablets (50 mg total) daily with breakfast for 2 days, THEN 4 tablets (40 mg total) daily with breakfast for 2 days, THEN 3 tablets (30 mg total) daily with breakfast for 2 days, THEN 2 tablets (20 mg total) daily with breakfast for 2 days, THEN 1 tablet (10 mg total) daily with breakfast for 2 days. 12 Day tapering Dose. Take in AM with breakfast.    Dispense:  42 tablet    Refill:  0   MR ANKLE RIGHT W WO CONTRAST  Discussed clinical findings with patient today.  Plan: -Starting patient on 12-day steroid taper for right ankle pain and capsulitis - Advised patient to return to previously received  cam walker boot or ankle brace.  Patient states that she has this at home - MRI ordered to evaluate involvement of tendinous and ligamentous structures. Suspect an element of anterior ankle impingement. -Follow-up after MRI has been obtained.   Kingdom Vanzanten L. Marchia Bond, AACFAS [facility]     [address] [place], [place] [address]                Office [phone]  Fax [phone]

## 2023-11-03 ENCOUNTER — Ambulatory Visit: Payer: Medicare HMO | Admitting: Podiatry

## 2023-11-04 ENCOUNTER — Encounter: Payer: Self-pay | Admitting: Podiatry

## 2023-11-11 ENCOUNTER — Other Ambulatory Visit: Payer: Self-pay | Admitting: Medical Genetics

## 2023-11-16 ENCOUNTER — Ambulatory Visit
Admission: RE | Admit: 2023-11-16 | Discharge: 2023-11-16 | Disposition: A | Discharge status: Home / Self Care | Payer: Medicare HMO | Source: Ambulatory Visit | Attending: Podiatry | Admitting: Podiatry

## 2023-11-16 DIAGNOSIS — M19071 Primary osteoarthritis, right ankle and foot: Secondary | ICD-10-CM

## 2023-11-16 DIAGNOSIS — M7751 Other enthesopathy of right foot: Secondary | ICD-10-CM

## 2023-11-16 DIAGNOSIS — M7750 Other enthesopathy of unspecified foot: Secondary | ICD-10-CM

## 2023-11-16 MED ORDER — GADOPICLENOL 0.5 MMOL/ML IV SOLN
7.5000 mL | Freq: Once | INTRAVENOUS | Status: AC | PRN
  Administered 2023-11-16: 7.5 mL via INTRAVENOUS

## 2023-11-24 ENCOUNTER — Encounter: Payer: Self-pay | Admitting: Podiatry

## 2023-11-24 ENCOUNTER — Ambulatory Visit (INDEPENDENT_AMBULATORY_CARE_PROVIDER_SITE_OTHER): Payer: Medicare HMO | Admitting: Podiatry

## 2023-11-24 DIAGNOSIS — M19071 Primary osteoarthritis, right ankle and foot: Secondary | ICD-10-CM | POA: Diagnosis not present

## 2023-11-24 DIAGNOSIS — M65979 Unspecified synovitis and tenosynovitis, unspecified ankle and foot: Secondary | ICD-10-CM

## 2023-11-24 DIAGNOSIS — M7750 Other enthesopathy of unspecified foot: Secondary | ICD-10-CM | POA: Diagnosis not present

## 2023-11-24 DIAGNOSIS — M7751 Other enthesopathy of right foot: Secondary | ICD-10-CM | POA: Diagnosis not present

## 2023-11-24 NOTE — Progress Notes (Unsigned)
Reports no improvement right ankle pain.  Wearing flats today.  Has not been wearing brace when cam boot.  States Vanessa Blackburn is going to talk to pain management doctor about changing her prescription as current pain medication given her undesired side effects. Reports MRI contrast gave her painful sensation MRI findings somewhat mild," which was intact though joint space did appear decreased, read as focal partial-thickness tear of peroneal tendon, focal partial thickness tear Achilles tendon.

## 2023-12-02 ENCOUNTER — Other Ambulatory Visit: Payer: Self-pay | Admitting: Oncology

## 2023-12-02 DIAGNOSIS — Z170 Estrogen receptor positive status [ER+]: Secondary | ICD-10-CM

## 2024-02-10 ENCOUNTER — Ambulatory Visit (INDEPENDENT_AMBULATORY_CARE_PROVIDER_SITE_OTHER): Payer: Medicare HMO | Admitting: Podiatry

## 2024-02-10 ENCOUNTER — Ambulatory Visit: Payer: Medicare HMO

## 2024-02-10 DIAGNOSIS — M5416 Radiculopathy, lumbar region: Secondary | ICD-10-CM

## 2024-02-10 DIAGNOSIS — M65979 Unspecified synovitis and tenosynovitis, unspecified ankle and foot: Secondary | ICD-10-CM

## 2024-02-10 DIAGNOSIS — M19071 Primary osteoarthritis, right ankle and foot: Secondary | ICD-10-CM

## 2024-02-10 DIAGNOSIS — M7750 Other enthesopathy of unspecified foot: Secondary | ICD-10-CM

## 2024-02-10 DIAGNOSIS — M19072 Primary osteoarthritis, left ankle and foot: Secondary | ICD-10-CM | POA: Diagnosis not present

## 2024-02-10 DIAGNOSIS — M25371 Other instability, right ankle: Secondary | ICD-10-CM

## 2024-02-10 NOTE — Progress Notes (Signed)
 Chief Complaint  Patient presents with   Arthritis    Here today in follow up of right ankle, arthritis. She is not wearing the brace today but states she does wear it. She reports that she is not any better then previous apts.  She has an apt with Tricia at 230 here today as well for the casting of the custom brace. She state her feet hurt and swell all the time.  Not diabetic and takes ASA 81    HPI: 73 y.o. female presents today for follow-up evaluation of right ankle pain.  States that she is having pain on both ankles and to the midfoot.  She is states that she does not want any further injections at this time.  She is presenting today to get fitted for custom ankle brace.  Patient does report history of back pain as well.  Does describe burning, tingling sensations going into both feet.  This also goes up her legs, into her buttocks and hips.  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  General: The patient is alert and oriented x3 in no acute distress.  Dermatology: Skin is warm, dry and supple bilateral lower extremities. Interspaces are clear of maceration and debris.    Vascular: Palpable pedal pulses bilaterally. Capillary refill within normal limits.  No appreciable edema.  No erythema or  calor.  Neurological: Light touch sensation grossly intact bilateral feet.  Negative Tinel's sign.  Subjective neuropathy symptoms.  Pain to ipsilateral hamstrings buttocks, hips and low back with ipsilateral single-leg raise  Musculoskeletal Exam: Pain on palpation of the anterior extensor tendons and medial ankle soft tissue structures including the deltoid ligament and PT tendon.  Pain on palpation of anterior ankle joint capsule.  Pain with resisted dorsiflexion with, muscle strength 4/5 in dorsiflexion and inversion.  Decreased right ankle joint dorsiflexion noted.  Radiographic Exam: 10/06/2023 right ankle radiographs Normal osseous mineralization.  No fractures or acute osseous abnormalities appreciated.  Decrease ankle joint space appreciated.  Spurring present to anterior tibia  MRI results 11/16/2023 IMPRESSION 1. Moderate patchy marrow edema and enhancement within the medial aspect of the talar neck and head and mildly extending into the adjacent medial aspect of the talar body. This may reflect chronic stress related change. No definite acute fracture is seen. 2. Moderate third and fourth tarsometatarsal osteoarthritis. More mild to moderate second and fifth tarsometatarsal osteoarthritis. 3. Minimal peroneus longus and brevis tenosynovitis. Very mild, not fluid bright, longitudinal partial-thickness interstitial tear of the peroneus brevis tendon at the level of the mid height of the calcaneus. 4. Trace fluid within the ventral aspect of the distal Achilles tendon, likely a tiny and shallow longitudinal partial-thickness surface tear. Minimal fluid within the adjacent pre Achilles bursa.  No full-thickness tear.  Assessment/Plan of Care: 1. Lumbar radiculopathy, chronic   2. Arthritis of both ankles   3. Arthritis of right midfoot      No orders of the defined types were placed in this encounter.  None  Discussed clinical findings with patient today.  She is getting  fitted for a custom ankle brace today.  Do believe that she will have some benefit from this.  In talking to the patient, feel that her symptoms also have a neurologic component.  We did discuss spinal cord stimulator devices.  She is potentially interested in this.  She currently sees a Writer through spine scoliosis center in [place] and I advised that she discuss spinal cord stimulation with her doctor.  [employer] information regarding spinal cord stimulator dispensed to patient.  She can contact us  if she would like us  to put her in contact with a representative or with another provider as well.  In the interim we will see how she does with custom brace, follow-up as needed  Tamsyn Owusu L. Lunda Salines, AACFAS [facility]     [address] [place], [place] [address]                Office [phone]  Fax [phone]

## 2024-02-11 ENCOUNTER — Encounter: Payer: Self-pay | Admitting: Podiatry

## 2024-02-11 NOTE — Progress Notes (Signed)
 Patient was present and evaluated for Custom Right ankle [place]  brace  Patient has Hx of Right ankle instability resulting from Arthritis, Tendonitis and Peroneal tendosynovitis. Patient has tried using lace up ankle braces but is needing a more rigid and supportive device. DPM has ordered [place]  custom ankle brace for greater support and control to help increase ambulation and Activities of daily living  AFO will be worn with supportive shoe gear helping to control Right ankle in Coronal plane to help increase ambulation and reduce risk of falls   Patient was casted today will need to submit for prior auth to Insurance. If approved cast will be scanned and order placed with [place]  brace company then patient to be fit upon receipt  Britton Cane Cped, CFo, CFm

## 2024-02-19 ENCOUNTER — Encounter: Payer: Self-pay | Admitting: Neurology

## 2024-03-27 ENCOUNTER — Encounter (HOSPITAL_BASED_OUTPATIENT_CLINIC_OR_DEPARTMENT_OTHER): Payer: Self-pay

## 2024-03-27 ENCOUNTER — Ambulatory Visit (HOSPITAL_BASED_OUTPATIENT_CLINIC_OR_DEPARTMENT_OTHER): Admission: EM | Admit: 2024-03-27 | Discharge: 2024-03-27 | Disposition: A | Discharge status: Home / Self Care

## 2024-03-27 ENCOUNTER — Ambulatory Visit (HOSPITAL_BASED_OUTPATIENT_CLINIC_OR_DEPARTMENT_OTHER)
Admit: 2024-03-27 | Discharge: 2024-03-27 | Disposition: A | Discharge status: Home / Self Care | Attending: Family Medicine | Admitting: Family Medicine

## 2024-03-27 DIAGNOSIS — S40861A Insect bite (nonvenomous) of right upper arm, initial encounter: Secondary | ICD-10-CM | POA: Diagnosis not present

## 2024-03-27 DIAGNOSIS — R1084 Generalized abdominal pain: Secondary | ICD-10-CM

## 2024-03-27 DIAGNOSIS — W57XXXA Bitten or stung by nonvenomous insect and other nonvenomous arthropods, initial encounter: Secondary | ICD-10-CM

## 2024-03-27 DIAGNOSIS — R197 Diarrhea, unspecified: Secondary | ICD-10-CM

## 2024-03-27 LAB — POCT URINALYSIS DIP (MANUAL ENTRY)
Glucose, UA: NEGATIVE mg/dL
Ketones, POC UA: NEGATIVE mg/dL
Leukocytes, UA: NEGATIVE — AB
Nitrite, UA: NEGATIVE
Protein Ur, POC: 30 mg/dL — AB
Spec Grav, UA: >=1.03 — AB (ref 1.010–1.025)
Urobilinogen, UA: 0.2 U/dL
pH, UA: 5.5 (ref 5.0–8.0)

## 2024-03-27 MED ORDER — MUPIROCIN 2 % EX OINT: 1.0000 | TOPICAL_OINTMENT | Freq: Two times a day (BID) | CUTANEOUS | 0 refills | Status: AC

## 2024-03-27 MED ORDER — DOXYCYCLINE HYCLATE 100 MG PO CAPS: 100.0000 mg | ORAL_CAPSULE | Freq: Two times a day (BID) | ORAL | 0 refills | Status: AC

## 2024-03-27 NOTE — ED Triage Notes (Addendum)
 Tick bite one week ago Thursday to left arm. Pulled tick out skin and all. States Monday, after a potluck dinner at church, had diarrhea. Still having bouts of diarrhea with abd cramping.

## 2024-03-27 NOTE — ED Provider Notes (Addendum)
 [facility]essa Black[facility] C[facility]    CSN: [id number] Arrival date & time: 03/27/24  1326      History   Chief Complaint Chief Complaint  Patient presents with   Insect Bite   Diarrhea    HPI Vanessa Blackburn is a 73 y.o. female.   Patient had a tick on her right arm on Thursday, 03/18/2024.  When she noticed it picked at it until she can get up to go up and clean the area.  2 days later it was red and hard and swollen and she thought there was some drip so she picked at it until she picked the scab off and got a lot of skin itching.  So now it is a much larger area about 2 or 3 mm crusted area.  It is erythematous but there is no discharge.  On Sunday, 03/21/2024, they had a church covered dish dinner and she ate some sliced tomatoes but otherwise did not eat a lot.  Ever since that dinner she has had loose stools and diarrhea.  She reports diarrhea as often as every 15 minutes at some points.  She has not eaten very much food at all in the last week.  She is trying to keep hydrated.  She has a 10 Bonner to her chronic constipation and her daughter wondered if this diarrhea was seepage around constipation.  Patient feels like the volume of stool has to be in amount it is getting her empty she does not think she is constipated.  She said the frequency of bowel movement has slowed down quite a bit but she still having loose stools with some small balls of more formed stool in the liquid.  She is reporting 5-6 stools a day right now.   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   Osteoporosis    Skin cancer     Patient Active Problem List   Diagnosis Date Noted   Osteoporosis 10/23/2020    Class: Chronic   Lymphedema of right upper extremity 09/21/2020    Class: Chronic    Preop cardiovascular exam 11/25/2019   Cardiac murmur 11/25/2019   History of smoking 11/25/2019   History of breast cancer 11/25/2019   Mixed dyslipidemia 11/25/2019   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      Home Medications    Prior to Admission medications   Medication Sig Start Date End Date Taking? Authorizing Provider  doxycycline (VIBRAMYCIN) 100 MG capsule Take 1 capsule (100 mg total) by mouth 2 (two) times daily for 10 days. 03/27/24 04/06/24 Yes Guss Legacy, FNP  HYDROcodone-acetaminophen (NORCO/VICODIN) 5-325 MG tablet Take 1 tablet by mouth every 6 (six) hours as needed for moderate pain (pain score 4-6).   Yes [provider]  mupirocin  ointment (BACTROBAN) 2 % Apply 1 Application topically 2 (two) times daily. 03/27/24  Yes Guss Legacy, FNP  albuterol (VENTOLIN HFA) 108 (90 Base) MCG/ACT inhaler Inhale into the lungs.    [provider]  ALPRAZolam (XANAX) 0.5 MG tablet Take 1 tablet by mouth as needed. 08/25/20   [provider]  anastrozole  (ARIMIDEX ) 1 MG tablet TAKE 1 TABLET BY MOUTH EVERY DAY 12/03/23   Nolia Baumgartner, MD  aspirin 81 MG EC tablet Take by mouth.    [provider]  atorvastatin (LIPITOR) 10 MG tablet Take 10 mg by mouth daily.  03/24/18   [provider]  buPROPion (WELLBUTRIN XL) 150 MG 24 hr tablet  09/10/16   [provider]  calcitonin, salmon, (MIACALCIN/FORTICAL) 200 UNIT/ACT nasal spray SPRAY 1  SPRAY ALTERNATE NOSTRIL ONCE DAILY 10/08/23   Mosher, Kelli A, PA-C  CEQUA 0.09 % SOLN Apply 1 drop to eye 2 (two) times daily. 03/14/22   [provider]  diclofenac (VOLTAREN) 75 MG EC tablet Take 75 mg by mouth 2 (two) times daily. 02/03/23   [provider]  DROPLET PEN NEEDLES 31G X 8 MM MISC  07/11/20   [provider]  Multiple Vitamin (MULTIVITAMIN) capsule Take by mouth.    [provider]  NALOCET 2.5-300 MG per tablet Take by mouth. 02/06/23   [provider]  Niacinamide-Zn-Cu-Methfo-Se-Cr (NICOTINAMIDE PO) Take 500 mg by mouth in the morning and at bedtime.    [provider]  omeprazole (PRILOSEC) 40 MG capsule Take 40 mg by mouth daily. 03/26/23   [provider]  pregabalin (LYRICA) 150 MG capsule Take 150 mg by mouth 2 (two) times daily. 03/26/23   [provider]  TYRVAYA 0.03 MG/ACT SOLN Place into both nostrils. 03/15/22   [provider]  venlafaxine XR (EFFEXOR-XR) 37.5 MG 24 hr capsule Take 37.5 mg by mouth.    [provider]    Family History Family History  Problem Relation Age of Onset   Cancer Mother 11       female cancer - unknown type   Cancer Father 23       throat cancer; +cig and ETOH   Cancer Sister 61       breast   Cancer Brother 25       colorectal   Cancer Maternal Aunt        three maternal aunts with breast cancer at young ages   Cancer Paternal Aunt        breast cancer at an unknown age   Cancer Maternal Grandmother        breast cancer at a young age; stomach cancer at an older age   Cancer Maternal Grandfather        brain cancer at an older age   Cancer Paternal Grandmother        bone cancer at an older age   Cancer Sister 57       melanoma on back    Social History Social History   Tobacco Use   Smoking status: Former    Current packs/day: 0.00    Types: Cigarettes    Quit date: 2015    Years since quitting: 10.4   Smokeless tobacco: Never   Substance Use Topics   Alcohol use: Never   Drug use: Never     Allergies   Ciprofloxacin, Codeine, and Levofloxacin   Review of Systems Review of Systems  Constitutional:  Negative for chills and fever.  HENT:  Negative for ear pain and sore throat.   Eyes:  Negative for pain and visual disturbance.  Respiratory:  Negative for cough and shortness of breath.   Cardiovascular:  Negative for chest pain and palpitations.  Gastrointestinal:  Positive for abdominal pain and diarrhea. Negative for constipation, nausea and vomiting.  Genitourinary:  Negative for dysuria and hematuria.  Musculoskeletal:  Negative for arthralgias and back pain.  Skin:  Positive for wound (Tick bite on right arm.). Negative for color change and rash.  Neurological:  Negative for seizures and syncope.  All other systems reviewed and are negative.    Physical Exam Triage Vital Signs ED Triage Vitals  Encounter Vitals Group     BP 03/27/24 1339 114/72     Systolic BP Percentile --      Diastolic BP Percentile --      Pulse Rate 03/27/24 1339 89     Resp 03/27/24 1339 20     Temp 03/27/24 1339 99.1 F (37.3 C)     Temp Source 03/27/24 1339 Oral     SpO2 03/27/24 1339 95 %     Weight --      Height --      Head Circumference --      Peak Flow --      Pain Score 03/27/24 1341 8     Pain Loc --      Pain Education --      Exclude from Growth Chart --    No data found.  Updated Vital Signs BP 114/72 (BP Location: Left Arm)   Pulse 89   Temp 99.1 F (37.3 C) (Oral)   Resp 20   LMP  (LMP Unknown)   SpO2 95%   Visual Acuity Right Eye Distance:   Left Eye Distance:   Bilateral Distance:    Right Eye Near:   Left Eye Near:    Bilateral Near:     Physical Exam Vitals and nursing note reviewed.  Constitutional:      General: She is not in acute distress.    Appearance: She is well-developed. She is not ill-appearing or toxic-appearing.  HENT:     Head: Normocephalic and atraumatic.      Right Ear: Hearing, tympanic membrane, ear canal and external ear normal.     Left Ear: Hearing, tympanic membrane, ear canal and external ear normal.     Nose: No congestion or rhinorrhea.     Right Sinus: No maxillary sinus tenderness or frontal sinus tenderness.     Left Sinus: No maxillary sinus tenderness or frontal sinus tenderness.     Mouth/Throat:     Lips: Pink.     Mouth: Mucous membranes are moist.     Pharynx: Uvula midline. No oropharyngeal exudate or posterior oropharyngeal erythema.     Tonsils: No tonsillar exudate.  Eyes:     Conjunctiva/sclera: Conjunctivae normal.     Pupils: Pupils are equal, round, and reactive to light.  Cardiovascular:     Rate and Rhythm: Normal rate and regular rhythm.     Heart sounds: S1 normal and S2 normal. No murmur heard. Pulmonary:     Effort: Pulmonary effort is normal. No respiratory distress.     Breath sounds: Normal breath sounds. No decreased breath sounds, wheezing, rhonchi or rales.  Abdominal:     General: Bowel sounds are normal.     Palpations: Abdomen is soft.     Tenderness: There is generalized abdominal tenderness (Mild to moderate).  Musculoskeletal:        General: No swelling.     Cervical back: Neck supple.  Lymphadenopathy:     Head:     Right side of head: No submental, submandibular, tonsillar, preauricular or posterior auricular adenopathy.     Left side of head: No submental, submandibular, tonsillar, preauricular or posterior auricular adenopathy.     Cervical: No cervical adenopathy.     Right cervical: No superficial cervical adenopathy.    Left cervical: No superficial cervical adenopathy.  Skin:    General: Skin is warm and dry.     Capillary Refill: Capillary refill takes less than 2 seconds.     Findings: Wound (Crusted area on top of right forearm close to the elbow is approximately 2 mm in size.  The area has mild erythema and no induration.  See photo for more information.) present. No rash.   Neurological:     Mental Status: She is alert and oriented to person, place, and time.  Psychiatric:        Mood and Affect: Mood normal.      UC Treatments / Results  Labs (all labs ordered are listed, but only abnormal results are displayed) Labs Reviewed  POCT URINALYSIS DIP (MANUAL ENTRY) - Abnormal; Notable for the following components:      Result Value   Clarity, UA hazy (*)    Bilirubin, UA small (*)    Spec Grav, UA >=1.030 (*)    Blood, UA trace-intact (*)    Protein Ur, POC =30 (*)    Leukocytes, UA Negative (*)    All other components within normal limits    EKG   Radiology DG Abd 1 View Result Date: 03/27/2024 CLINICAL DATA:  Abdominal pain. EXAM: ABDOMEN - 1 VIEW COMPARISON:  None Available. FINDINGS: The bowel gas pattern is non-obstructive. No evidence of pneumoperitoneum, within the limitations of a supine film. No acute osseous abnormalities. The soft tissues are within normal limits. Surgical changes, devices, tubes and lines: None. IMPRESSION: *Nonobstructive bowel gas pattern. Electronically Signed   By: Beula Brunswick M.D.   On: 03/27/2024 14:35    Procedures Procedures (including critical care time)  Medications Ordered in UC Medications - No data to display  Initial Impression / Assessment and Plan / UC Course  I have reviewed the triage vital signs and the nursing notes.  Pertinent labs & imaging results that were available during my care of the patient were reviewed by me and considered in my medical decision making (see chart for details).  Plan of Care: Abdominal pain and diarrhea: Abdominal x-ray shows some stool and a lot of gas.  I believe her abdominal pain and diarrhea was either a reaction to something she ate or you have a viral gastroenteritis.  Abided dietary handout on foods to avoid to limit diarrhea.  Do not use OTC Imodium.  Plenty of fluids and bland foods to prevent diarrhea and dehydration.  Urinalysis is concentrated but no sign  of acute UTI.  We have discussed hydration and prevention of dehydration.  Tick bite right arm: Will treat with doxycycline 100 mg twice daily for 10 days.  Follow-up if rash does not completely resolve and infection does not completely improved.  Might need testing for Lyme or [facility] spotted fever.  Follow-up if symptoms do not improve, worsen or new symptoms occur.  I reviewed the plan of care with the patient and/or the patient's guardian.  The patient and/or guardian had time to  ask questions and acknowledged that the questions were answered.  I provided instruction on symptoms or reasons to return here or to go to an ER, if symptoms/condition did not improve, worsened or if new symptoms occurred.  Final Clinical Impressions(s) / UC Diagnoses   Final diagnoses:  Diffuse abdominal pain  Diarrhea, unspecified type  Tick bite of right upper arm, initial encounter     Discharge Instructions      Abdominal pain and diarrhea: The patient either had some reaction to food or even just a viral gastroenteritis.  Her x-ray shows some stool and a lot of gas but no other acute problems.  Encouraged bland food like clear liquids and the brat diet.  Provided dietary handout on foods to avoid to help prevent or control diarrhea.  Do not use Imodium as it can create severe constipation and worsen viral diarrhea.  Keep good fluid intake to prevent dehydration.  Tick bite right arm: Doxycycline 100 mg twice daily for 10 days.  Clean with warm soapy fingers, rinse, pat dry, apply mupirocin ointment at the site.  If the site does not improving and the infection resolving, needs a recheck with her family doctor or here.  She may need testing for Lyme disease.  Follow-up if symptoms do not improve, worsen or new symptoms occur.   ED Prescriptions     Medication Sig Dispense Auth. Provider   doxycycline (VIBRAMYCIN) 100 MG capsule Take 1 capsule (100 mg total) by mouth 2 (two) times daily for 10 days.  20 capsule Guss Legacy, FNP   mupirocin ointment (BACTROBAN) 2 % Apply 1 Application topically 2 (two) times daily. 22 g Guss Legacy, FNP      PDMP not reviewed this encounter.   Guss Legacy, FNP 03/27/24 1449    Guss Legacy, FNP 03/27/24 [phone]

## 2024-03-27 NOTE — Discharge Instructions (Addendum)
 Abdominal pain and diarrhea: The patient either had some reaction to food or even just a viral gastroenteritis.  Her x-ray shows some stool and a lot of gas but no other acute problems.  Encouraged bland food like clear liquids and the brat diet.  Provided dietary handout on foods to avoid to help prevent or control diarrhea.  Do not use Imodium as it can create severe constipation and worsen viral diarrhea.  Keep good fluid intake to prevent dehydration.  Tick bite right arm: Doxycycline 100 mg twice daily for 10 days.  Clean with warm soapy fingers, rinse, pat dry, apply mupirocin ointment at the site.  If the site does not improving and the infection resolving, needs a recheck with her family doctor or here.  She may need testing for Lyme disease.  Follow-up if symptoms do not improve, worsen or new symptoms occur.

## 2024-04-12 ENCOUNTER — Encounter: Payer: Self-pay | Admitting: Neurology

## 2024-04-12 ENCOUNTER — Ambulatory Visit (INDEPENDENT_AMBULATORY_CARE_PROVIDER_SITE_OTHER): Admitting: Neurology

## 2024-04-12 ENCOUNTER — Other Ambulatory Visit (HOSPITAL_COMMUNITY)

## 2024-04-12 VITALS — BP 145/80 | HR 82 | Ht 60.0 in | Wt 148.0 lb

## 2024-04-12 DIAGNOSIS — M48062 Spinal stenosis, lumbar region with neurogenic claudication: Secondary | ICD-10-CM | POA: Diagnosis not present

## 2024-04-12 NOTE — Progress Notes (Signed)
 [facility] Clinic Note - Initial Visit   Date: 04/12/2024   Vanessa Blackburn MRN: [id number] DOB: [birth year]   Dear Dr. Sabas:  Thank you for your kind referral of Vanessa Blackburn for consultation of bilateral leg numbness. Although her history is well known to you, please allow us  to reiterate it for the purpose of our medical record. The patient was accompanied to the clinic by self.     Vanessa Blackburn is a 73 y.o. female with depression, GERD, asthma, right breast cancer, hyperlipidemia, and lumbar canal stenosis presenting for evaluation of bilateral leg numbness.   IMPRESSION/PLAN: Lumbar canal stenosis with neurogenic claudication.  MRI lumbar spine from June 2024 was reviewed which shows compressive spinal stenosis at L5-S1 as well as disc herniation at L4-5 with moderate spinal stenosis.  She is having progressive symptoms of neurogenic claudication, so will refer to neurosurgery for further management.   ------------------------------------------------------------- History of present illness: Since around 2018, she began experiencing spells of numbness involving her legs with low back pain.  She has seen [facility] Sc[facility] in the past and was found to have spinal canal stenosis at L5-S1 and had ESI which initially helped.  Over the past few months, she is having progressive numbness from the waist down into her feet, which occurs several times per day, and triggered by prolonged sitting, standing, and walking.  She has some imbalance and low back pain.  No recent falls.  She walks unassisted.  She takes Lyrica 150mg  twice daily and hydrocodone 5/325 1-2 times per day.  Her most recent MRI lumbar spine from June 2024 continues to show compressive spinal stenosis at L5-S1 and moderate stenosis at L4-5.       Out-side paper records, electronic medical record, and images have been reviewed where available and summarized  as:  MRI lumbar spine 04/02/2023: 1. L4-5 right paracentral herniation since 2020 causing moderate spinal stenosis and right L5 impingement at the subarticular recess. 2. Chronic L5-S1 degeneration with anterolisthesis and compressive spinal stenosis.    MRI lumbar spine wo contrast 05/23/2018:  L4-5: No stenosis. Facet osteoarthritis with mild edema that could be symptomatic.   L5-S1: Advanced bilateral facet arthropathy with 9 mm of anterolisthesis. Bulging of the disc. Stenosis of the canal and foramina that could cause neural compression on either or both sides and could certainly relate to low back pain. I think the primary process is that of facet arthropathy rather than primary pars defect, but it is possible there could be an acquired pars fracture in addition to the facet arthropathy.     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   ALPRAZolam (XANAX) 0.5 MG tablet Take 1 tablet by mouth as needed.   anastrozole  (ARIMIDEX ) 1 MG tablet TAKE 1 TABLET BY MOUTH EVERY DAY   aspirin 81 MG EC tablet Take by mouth.   atorvastatin (LIPITOR) 10 MG tablet Take 10 mg by mouth daily.    buPROPion (WELLBUTRIN XL)  150 MG 24 hr tablet    CEQUA 0.09 % SOLN Apply 1 drop to eye 2 (two) times daily.   DROPLET PEN NEEDLES 31G X 8 MM MISC    HYDROcodone-acetaminophen (NORCO/VICODIN) 5-325 MG tablet Take 1 tablet by mouth every 6 (six) hours as needed for moderate pain (pain score 4-6).   Multiple Vitamin (MULTIVITAMIN)  capsule Take by mouth.   mupirocin  ointment (BACTROBAN ) 2 % Apply 1 Application topically 2 (two) times daily.   NALOCET 2.5-300 MG per tablet Take by mouth.   Niacinamide-Zn-Cu-Methfo-Se-Cr (NICOTINAMIDE PO) Take 500 mg by mouth in the morning and at bedtime.   omeprazole (PRILOSEC) 40 MG capsule Take 40 mg by mouth daily.   pregabalin (LYRICA) 150 MG capsule Take 150 mg by mouth 2 (two) times daily.   TYRVAYA 0.03 MG/ACT SOLN Place into both nostrils.   venlafaxine XR (EFFEXOR-XR) 37.5 MG 24 hr capsule Take 37.5 mg by mouth.   calcitonin, salmon, (MIACALCIN/FORTICAL) 200 UNIT/ACT nasal spray SPRAY 1 SPRAY ALTERNATE NOSTRIL ONCE DAILY (Patient not taking: Reported on 04/12/2024)   diclofenac (VOLTAREN) 75 MG EC tablet Take 75 mg by mouth 2 (two) times daily. (Patient not taking: Reported on 04/12/2024)   No facility-administered encounter medications on file as of 04/12/2024.    Allergies:  Allergies  Allergen Reactions   Ciprofloxacin Other (See Comments)    unknown  Other reaction(s): Other (See Comments)  Unknown reaction   Codeine Itching   Levofloxacin Other (See Comments)    Other reaction(s): Other (See Comments)  Unknown reaction    Family History: Family History  Problem Relation Age of Onset   Cancer Mother 71       female cancer - unknown type   Cancer Father 31       throat cancer; +cig and ETOH   Cancer Sister 93       breast   Cancer Sister 75       melanoma on back   Cancer Brother 25       colorectal   Cancer Maternal Aunt        three maternal aunts with breast cancer at young ages   Cancer Paternal Aunt        breast cancer at an unknown age   Cancer Maternal Grandmother        breast cancer at a young age; stomach cancer at an older age   Cancer Maternal Grandfather        brain cancer at an older age   Cancer Paternal Grandmother        bone cancer at an older age    Social History: Social History   Tobacco Use   Smoking status: Former     Current packs/day: 0.00    Types: Cigarettes    Quit date: 2015    Years since quitting: 10.4   Smokeless tobacco: Never  Substance Use Topics   Alcohol use: Never   Drug use: Never   Social History   Social History Narrative   Not on file    Vital Signs:  BP (!) 145/80   Pulse 82   Ht 5' (1.524 m)   Wt 148 lb (67.1 kg)   LMP  (LMP Unknown)   SpO2 96%   BMI 28.90 kg/m   Neurological Exam: MENTAL STATUS including orientation to time, place, person, recent and remote memory, attention span and concentration, language, and fund of knowledge is normal.  Speech is not dysarthric.  CRANIAL NERVES: II:  No visual field defects.     III-IV-VI: Pupils equal round and reactive to light.  Normal conjugate, extra-ocular eye movements in all directions of gaze.  No nystagmus.  No ptosis.   V:  Normal facial sensation.    VII:  Normal facial symmetry and movements.   VIII:  Normal hearing and vestibular function.   IX-X:  Normal palatal movement.   XI:  Normal shoulder shrug and head rotation.   XII:  Normal tongue strength and range of motion, no deviation or fasciculation.  MOTOR:  No atrophy, fasciculations or abnormal movements.  No pronator drift.   Upper Extremity:  Right  Left  Deltoid  5/5   5/5   Biceps  5/5   5/5   Triceps  5/5   5/5   Wrist extensors  5/5   5/5   Wrist flexors  5/5   5/5   Finger extensors  5/5   5/5   Finger flexors  5/5   5/5   Dorsal interossei  5/5   5/5   Abductor pollicis  5/5   5/5   Tone (Ashworth scale)  0  0   Lower Extremity:  Right  Left  Hip flexors  5/5   5/5   Knee flexors  5/5   5/5   Knee extensors  5/5   5/5   Dorsiflexors  5/5   5/5   Plantarflexors  5/5   5/5   Toe extensors  5/5   5/5   Toe flexors  5/5   5/5   Tone (Ashworth scale)  0  0   MSRs:                                           Right        Left brachioradialis 2+  2+  biceps 2+  2+  triceps 2+  2+  patellar 3+  3+  ankle jerk 2+  2+  Hoffman no  no   plantar response down  down   SENSORY:  Normal and symmetric perception of light touch, pinprick, vibration, and temperature.   COORDINATION/GAIT: Normal finger-to- nose-finger.  Intact rapid alternating movements bilaterally.  Gait narrow based and stable. Unassisted.   Thank you for allowing me to participate in patient's care.  If I can answer any additional questions, I would be pleased to do so.    Sincerely,    Finnbar Cedillos K. Tobie, DO

## 2024-04-26 ENCOUNTER — Telehealth: Payer: Self-pay | Admitting: Oncology

## 2024-04-26 NOTE — Telephone Encounter (Signed)
 Patient has been scheduled for follow-up visit per 04/26/24 LOS.  Pt aware of scheduled appt details.

## 2024-05-12 NOTE — Progress Notes (Signed)
 Prior auth requested via fax to [facility]

## 2024-05-20 ENCOUNTER — Telehealth: Payer: Self-pay | Admitting: Oncology

## 2024-05-20 ENCOUNTER — Other Ambulatory Visit: Payer: Self-pay | Admitting: Oncology

## 2024-05-20 ENCOUNTER — Inpatient Hospital Stay (HOSPITAL_BASED_OUTPATIENT_CLINIC_OR_DEPARTMENT_OTHER): Admitting: Oncology

## 2024-05-20 ENCOUNTER — Inpatient Hospital Stay: Attending: Oncology

## 2024-05-20 VITALS — BP 125/75 | HR 74 | Temp 98.2°F | Resp 18 | Ht 60.0 in | Wt 146.1 lb

## 2024-05-20 DIAGNOSIS — C50511 Malignant neoplasm of lower-outer quadrant of right female breast: Secondary | ICD-10-CM | POA: Diagnosis present

## 2024-05-20 DIAGNOSIS — C4359 Malignant melanoma of other part of trunk: Secondary | ICD-10-CM | POA: Diagnosis not present

## 2024-05-20 DIAGNOSIS — C50311 Malignant neoplasm of lower-inner quadrant of right female breast: Secondary | ICD-10-CM | POA: Diagnosis not present

## 2024-05-20 DIAGNOSIS — Z170 Estrogen receptor positive status [ER+]: Secondary | ICD-10-CM

## 2024-05-20 LAB — CBC WITH DIFFERENTIAL (CANCER CENTER ONLY)
Abs Immature Granulocytes: 0.03 K/uL (ref 0.00–0.07)
Basophils Absolute: 0.1 K/uL (ref 0.0–0.1)
Basophils Relative: 1 %
Eosinophils Absolute: 0.1 K/uL (ref 0.0–0.5)
Eosinophils Relative: 1 %
HCT: 38.4 % (ref 36.0–46.0)
Hemoglobin: 12.9 g/dL (ref 12.0–15.0)
Immature Granulocytes: 0 %
Lymphocytes Relative: 38 %
Lymphs Abs: 2.9 K/uL (ref 0.7–4.0)
MCH: 31.2 pg (ref 26.0–34.0)
MCHC: 33.6 g/dL (ref 30.0–36.0)
MCV: 92.8 fL (ref 80.0–100.0)
Monocytes Absolute: 0.6 K/uL (ref 0.1–1.0)
Monocytes Relative: 7 %
Neutro Abs: 4.1 K/uL (ref 1.7–7.7)
Neutrophils Relative %: 53 %
Platelet Count: 290 K/uL (ref 150–400)
RBC: 4.14 MIL/uL (ref 3.87–5.11)
RDW: 12.8 % (ref 11.5–15.5)
WBC Count: 7.7 K/uL (ref 4.0–10.5)
nRBC: 0 % (ref 0.0–0.2)

## 2024-05-20 LAB — CMP (CANCER CENTER ONLY)
ALT: 18 U/L (ref 0–44)
AST: 22 U/L (ref 15–41)
Albumin: 4.3 g/dL (ref 3.5–5.0)
Alkaline Phosphatase: 113 U/L (ref 38–126)
Anion gap: 11 (ref 5–15)
BUN: 21 mg/dL (ref 8–23)
CO2: 28 mmol/L (ref 22–32)
Calcium: 10.2 mg/dL (ref 8.9–10.3)
Chloride: 106 mmol/L (ref 98–111)
Creatinine: 0.93 mg/dL (ref 0.44–1.00)
GFR, Estimated: >60 mL/min (ref >60)
Glucose, Bld: 96 mg/dL (ref 70–99)
Potassium: 4.2 mmol/L (ref 3.5–5.1)
Sodium: 144 mmol/L (ref 135–145)
Total Bilirubin: 0.4 mg/dL (ref 0.0–1.2)
Total Protein: 6.9 g/dL (ref 6.5–8.1)

## 2024-05-20 NOTE — Telephone Encounter (Signed)
 Patient has been scheduled for follow-up visit per 05/20/24 LOS.  Pt given an appt calendar with date and time.

## 2024-05-20 NOTE — Progress Notes (Signed)
 [facility]  [address] [place],  [place]  [address] [phone]  Clinic Day: 05/20/2024  Referring physician: Sabas Norleen PARAS., MD   CHIEF COMPLAINT:  CC:   Clinical stage III B hormone receptor positive breast cancer  Current Treatment:   Anastrozole  1 mg daily for total of 10 years   HISTORY OF PRESENT ILLNESS:  Vanessa Blackburn is a 73 y.o. 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 She had locally advanced disease in the right medial breast measuring about 3 cm, with involvement of the skin.  Biopsy revealed invasive ductal carcinoma.  Estrogen and progesterone receptors were positive and her 2 Neu negative.  Ki-67 was 14%.  There was no palpable adenopathy, but MRI revealed a 5 mm right axillary node, which was suspicious for metastasis.  PET scan did not show any distant metastasis.  She initially refused chemotherapy, so was placed on neoadjuvant tamoxifen, but had a minimal response to this.  She then developed a melanoma on the upper outer quadrant of the right breast and was seen at [facility].  She underwent bilateral mastectomies.  Pathology of the right breast revealed a 2.8 cm, grade 2, invasive ductal carcinoma with 1/14 nodes positive.  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  Pathology of the skin revealed a 0.4 mm melanoma, Clark's level 2 with a depth of 0.25 mm.  She had a second melanoma removed from the left lateral trunk in March, which was found to be a stage 0 (Tis N0 M0), arising in a dysplastic nevus.  Wide excision revealed no residual melanoma.  The patient was recommended for adjuvant chemotherapy for her breast cancer and was placed on docetaxel/cyclophosphamide in April 2016.  Unfortunately, she developed a severe skin rash requiring IV and oral steroids, felt to be secondary to  docetaxel.  We, therefore, switched her chemotherapy to CMF.  She tolerated this fairly well, but developed acute colitis after a third cycle.  After her 5th cycle, she developed recurrent symptoms of colitis, as well as a progressive rash and weight loss.  Chemotherapy was then discontinued, but we had only planned one more dose of the CMF.  She was then referred for radiation to the right chest wall, which was completed in December 2016.  She was placed on anastrozole  1mg  daily in January 2017.  Due to her personal family history of malignancy, she underwent testing for hereditary breast and ovarian cancer with the [facility] Breast and Ovarian Cancer panel test.  This did not reveal any clinically significant mutations or variants of uncertain significance.  She had an EGD and colonoscopy in October 2015.  Colonoscopy revealed diverticular disease, but no polyps.  EGD revealed an irregular GE junction.  Biopsy revealed benign squamocolumnar mucosa with chronic inflammation reactive changes, no intestinal metaplasia dysplasia or malignancy were identified.  She underwent impaired esophageal dilatation at that time and was advised to continue omeprazole 20 mg daily.  CT chest, abdomen and pelvis at [facility] in September 2017 did not reveal any evidence of malignancy or explanation of her abdominal symptoms.  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   Bone density scan in December 2017 revealed worsening osteoporosis with a T-score -3.3 in the spine.  She decided  against treatment with denosumab, so was placed on Miacalcin nasal spray in May 2018.  She has had multiple skin cancers but not another melanoma. Bone density scan in December 2018 revealed persistent osteoporosis with T-score of -3.1 in the spine, which was a 3.7% improvement in the bone density and a T-score of -2 in the femur which was a 0.5% improvement in bone  density.  Miacalcin was therefore continued.  Due to tenderness in the right mastectomy site, the patient underwent a ultrasound of the area in January 2019, which did not reveal any abnormality.  She had back pain and saw an orthopedic surgeon in [place], Dr. Eldonna in August 2019 and was treated with an epidural.   MRI lumbar spine in August 2019 revealed degenerative disc disease with some disc bulging, as well as advanced arthropathy at L5-S1 with stenosis of the canal and foramina causing neural compression.  She is a previous smoker and smoked for about 40 years, but not heavily, reporting about a pack every 3 days, so has an approximately 13 pack year history.  She quit smoking 3 years ago in 2016.  In 2020, she was placed Forteo injections for her osteoporosis, in addition to the Miacalcin.   She reported intermittent left chest pain in 2021, for which she had seen Dr. Edwyna and undergone stress test, which was apparently normal.  She was up-to-date on screening colonoscopy. She continued Forteo and Miacalcin for osteoporosis, as well as a multivitamin with calcium. She was scheduled for back surgery in July.   Due to all her symptoms, she underwent CT chest, abdomen and pelvis, which did not reveal any explanation for her symptoms.  There were subcentimeter lesions within the liver which were stable and consistent with a benign etiology.  There was left colonic diverticulosis without evidence of diverticulitis.  Bone density scan in June 2021 revealed slightly worsened bone density of the left femur neck with a T-score of -2.2, previously -1.9.  There was an increase in the bone density of the spine with a T-score of -2.6, previously -3.1.  We recommended  adding calcium at least daily in addition to her multivitamin.  We discussed alternative treatment for her osteoporosis with alendronate, Reclast, or Prolia, but she declined. She underwent cholecystectomy on May 2nd.  INTERVAL HISTORY:  Vanessa Blackburn  is here today for repeat examination for clinical stage III B hormone receptor positive breast cancer diagnosed September, 2015. In 2016, she also had resection of a stage I melanoma of the right breast and a stage 0 melanoma of the skin of the left trunk. Patient states that she feels well, but complains of lower back pain near the buttocks rated at 10/10. She is currently taking Lyrica 150 mg BID and hydrocodone 5-325 mg PRN to manage her pain through a pain clinic. She is still currently taking anastrozole  without difficulty. Her CBC shows a WBC of 7.7, hemoglobin of 12.9, and a platelet count of 290,000. Her CMP is completely normal. She recently had cataract surgery and she states that this has not helped at all. She also complains of blurred vision, but no other neurological symptoms. She informed me that she has intermittent head pain described as being hit with a hammer, but denies injury. I will order a brain MRI to investigate. If all is well on the MRI, I will see her back in 1 year with CBC and CMP. She denies fever, chills, night sweats, or other signs of infection. She denies cardiorespiratory and gastrointestinal  issues. She  denies pain. Her appetite is good and Her weight has decreased 2 pounds over last year.  REVIEW OF SYSTEMS:  Review of Systems  Constitutional:  Negative for appetite change, chills, diaphoresis, fatigue, fever and unexpected weight change.  HENT:   Negative for hearing loss, lump/mass, mouth sores, nosebleeds, sore throat, tinnitus, trouble swallowing and voice change.   Eyes:  Positive for eye problems (blurred vision, cataracts surgery).  Respiratory:  Negative for chest tightness, cough, hemoptysis, shortness of breath and wheezing.   Cardiovascular:  Negative for chest pain and leg swelling.  Gastrointestinal:  Negative for abdominal distention, abdominal pain, blood in stool, constipation, diarrhea, nausea (Intermittent without vomiting), rectal pain and vomiting.   Endocrine: Negative for hot flashes.  Genitourinary:  Negative for bladder incontinence, difficulty urinating, dyspareunia, dysuria, frequency, hematuria, menstrual problem, nocturia, pelvic pain, vaginal bleeding and vaginal discharge.   Musculoskeletal:  Positive for arthralgias (knee pain) and back pain (near buttocks 10/10). Negative for flank pain, gait problem, myalgias, neck pain and neck stiffness.  Skin:  Negative for itching, rash and wound.       Keratosis on left chest wall  Neurological:  Positive for headaches (left side). Negative for dizziness, extremity weakness, gait problem, light-headedness, numbness, seizures and speech difficulty.  Hematological:  Negative for adenopathy. Does not bruise/bleed easily.  Psychiatric/Behavioral:  Negative for depression and sleep disturbance. The patient is not nervous/anxious.        Decreased memory   VITALS:  Blood pressure 125/75, pulse 74, temperature 98.2 F (36.8 C), temperature source Oral, resp. rate 18, height 5' (1.524 m), weight 146 lb 1.6 oz (66.3 kg), SpO2 98%.  Wt Readings from Last 3 Encounters:  05/20/24 146 lb 1.6 oz (66.3 kg)  04/12/24 148 lb (67.1 kg)  04/09/23 153 lb 1.6 oz (69.4 kg)    Body mass index is 28.53 kg/m.  Performance status (ECOG): 1 - Symptomatic but completely ambulatory  PHYSICAL EXAM:  Physical Exam Vitals and nursing note reviewed.  Constitutional:      General: She is not in acute distress.    Appearance: Normal appearance.  HENT:     Head: Normocephalic and atraumatic.     Mouth/Throat:     Mouth: Mucous membranes are moist.     Pharynx: Oropharynx is clear. No oropharyngeal exudate or posterior oropharyngeal erythema.  Eyes:     General: No scleral icterus.    Extraocular Movements: Extraocular movements intact.     Conjunctiva/sclera: Conjunctivae normal.     Pupils: Pupils are equal, round, and reactive to light.  Cardiovascular:     Rate and Rhythm: Normal rate and regular  rhythm.     Heart sounds: Normal heart sounds. No murmur heard.    No friction rub. No gallop.  Pulmonary:     Effort: Pulmonary effort is normal.     Breath sounds: Normal breath sounds. No wheezing, rhonchi or rales.  Chest:  Breasts:    Right: Absent.     Left: Absent.     Comments: Bilateral mastectomy sites are negative. Abdominal:     General: There is no distension.     Palpations: Abdomen is soft. There is no hepatomegaly, splenomegaly or mass.     Tenderness: There is no abdominal tenderness.  Musculoskeletal:        General: Normal range of motion.     Cervical back: Normal range of motion and neck supple. No tenderness.     Right lower leg: No edema.  Left lower leg: No edema.  Lymphadenopathy:     Cervical: No cervical adenopathy.     Upper Body:     Right upper body: No supraclavicular or axillary adenopathy.     Left upper body: No supraclavicular or axillary adenopathy.     Lower Body: No right inguinal adenopathy. No left inguinal adenopathy.  Skin:    General: Skin is warm and dry.     Coloration: Skin is not jaundiced.     Findings: No rash.     Comments:  No suspicious lesions of the back chest or abdomen  Neurological:     Mental Status: She is alert and oriented to person, place, and time.     Cranial Nerves: No cranial nerve deficit.  Psychiatric:        Mood and Affect: Mood normal.        Behavior: Behavior normal.        Thought Content: Thought content normal.    LABS:      Latest Ref Rng & Units 05/20/2024    1:10 PM 04/09/2023    9:33 AM 04/08/2022   12:00 AM  CBC  WBC 4.0 - 10.5 K/uL 7.7  8.0  9.0      Hemoglobin 12.0 - 15.0 g/dL 87.0  87.4  86.6      Hematocrit 36.0 - 46.0 % 38.4  38.0  39      Platelets 150 - 400 K/uL 290  258  264         This result is from an external source.      Latest Ref Rng & Units 05/20/2024    1:10 PM 04/09/2023    9:33 AM 04/08/2022   12:00 AM  CMP  Glucose 70 - 99 mg/dL 96  877    BUN 8 - 23 mg/dL  21  21  21       Creatinine 0.44 - 1.00 mg/dL 9.06  9.16  0.7      Sodium 135 - 145 mmol/L 144  140  141      Potassium 3.5 - 5.1 mmol/L 4.2  3.9  4.0      Chloride 98 - 111 mmol/L 106  108  105      CO2 22 - 32 mmol/L 28  26  28       Calcium 8.9 - 10.3 mg/dL 89.7  9.0  9.3      Total Protein 6.5 - 8.1 g/dL 6.9  6.7    Total Bilirubin 0.0 - 1.2 mg/dL 0.4  0.4    Alkaline Phos 38 - 126 U/L 113  81  62      AST 15 - 41 U/L 22  21  25       ALT 0 - 44 U/L 18  24  22          This result is from an external source.   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   Osteoporosis    Skin cancer     Past Surgical History:  Procedure Laterality Date  CHOLECYSTECTOMY  02/19/2021   MASTECTOMY Left 12/18/2014   MASTECTOMY WITH AXILLARY LYMPH NODE DISSECTION Right 12/18/2014    Family History  Problem Relation Age of Onset   Cancer Mother 31       female cancer - unknown type   Cancer Father 10       throat cancer; +cig and ETOH   Cancer Sister 22       breast   Cancer Sister 58       melanoma on back   Cancer Brother 25       colorectal   Cancer Maternal Aunt        three maternal aunts with breast cancer at young ages   Cancer Paternal Aunt        breast cancer at an unknown age   Cancer Maternal Grandmother        breast cancer at a young age; stomach cancer at an older age   Cancer Maternal Grandfather        brain cancer at an older age   Cancer Paternal Grandmother        bone cancer at an older age     Social History:  reports that she quit smoking about 10 years ago. Her smoking use included cigarettes. She has never used smokeless tobacco. She reports that she does not drink alcohol and does not use drugs.The patient is alone today.  Allergies:  Allergies  Allergen Reactions   Ciprofloxacin Other (See Comments)    unknown  Other reaction(s): Other (See Comments)  Unknown reaction   Codeine Itching   Levofloxacin Other (See Comments)    Other reaction(s): Other (See Comments)  Unknown reaction   Moxifloxacin Other (See Comments)    Current Medications: Current Outpatient Medications  Medication Sig Dispense Refill   atorvastatin (LIPITOR) 20 MG tablet Take 20 mg by mouth daily.     albuterol (VENTOLIN HFA) 108 (90 Base) MCG/ACT inhaler Inhale into the lungs.     ALPRAZolam (XANAX) 0.5 MG tablet Take 1 tablet by mouth as needed.     anastrozole  (ARIMIDEX ) 1 MG tablet TAKE 1 TABLET BY MOUTH EVERY DAY 90 tablet 3   aspirin 81 MG EC tablet Take by mouth.     buPROPion (WELLBUTRIN XL) 150 MG 24 hr tablet      calcitonin, salmon, (MIACALCIN/FORTICAL) 200 UNIT/ACT nasal spray SPRAY 1 SPRAY ALTERNATE NOSTRIL ONCE DAILY (Patient not taking: Reported on 04/12/2024) 11.1 mL 5   CEQUA 0.09 % SOLN Apply 1 drop to eye 2 (two) times daily.     cetirizine  (ZYRTEC ) 10 MG tablet Take 1 tablet (10 mg total) by mouth daily as needed for allergies. 30 tablet 0   diclofenac (VOLTAREN) 75 MG EC tablet Take 75 mg by mouth 2 (two) times daily. (Patient not taking: Reported on 04/12/2024)     DROPLET PEN NEEDLES 31G X 8 MM MISC      HYDROcodone-acetaminophen (NORCO/VICODIN) 5-325 MG tablet Take 1 tablet by mouth every 6 (six) hours as needed for moderate pain (pain score 4-6).     Multiple Vitamin (MULTIVITAMIN) capsule Take by mouth.     mupirocin  ointment (BACTROBAN ) 2 % Apply 1 Application topically 2 (two) times daily. 22 g 0   NALOCET 2.5-300 MG per tablet Take by mouth.      Niacinamide-Zn-Cu-Methfo-Se-Cr (NICOTINAMIDE PO) Take 500 mg by mouth in the morning and at bedtime.     omeprazole (PRILOSEC) 40 MG capsule Take 40 mg by mouth daily.  pregabalin (LYRICA) 150 MG capsule Take 150 mg by mouth 2 (two) times daily.     TYRVAYA 0.03 MG/ACT SOLN Place into both nostrils.     venlafaxine XR (EFFEXOR-XR) 37.5 MG 24 hr capsule Take 37.5 mg by mouth.     No current facility-administered medications for this visit.     ASSESSMENT & PLAN:  Assessment:  1. 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  We recommend continuing anastrozole  daily for total of 10 years (January of 2027) due to the stage of her disease as long as her bone density remained fairly stable.  She had mild worsening in the femur neck in June of 2021 but improvement in the spine. She has declined repeat bone density scans, as she is not willing to take injections.  2. History of stage IA melanoma of the right breast treated with surgery.  3. 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  4. Osteoporosis. Bone density scan in June 2021 revealed slight worsening bone density in the left femur neck with increased bone density in the spine.  She continues Miacalcin and calcium 600mg  supplement daily.  5. History of multiple non melanoma skin cancers.  She will continue to follow up with the dermatologist regularly.  Plan:   She is currently taking Lyrica 150mg  BID and hydrocodone 5-325mg  PRN to manage her pain through a pain clinic. She is still currently taking anastrozole  without difficulty. Her CBC shows a WBC of 7.7, hemoglobin of 12.9, and a platelet count of 290,000. Her CMP is completely normal. She recently had cataracts surgery and she states that this has not helped at all. She also complains of blurred vision, but no other neurological symptoms. She informed me that she has intermittent head pain  described as being hit with a hammer, but denies injury. I will order a brain MRI to investigate. If all is well on the MRI, I will see her back in 1 year with CBC and CMP. The patient understands the plans discussed today and is in agreement with them.  She knows to contact our office if she develops concerns prior to her next appointment.  I provided 25 minutes of face-to-face time during this this encounter and > 50% was spent counseling as documented under my assessment and plan.   Wanda VEAR Cornish, MD  [facility] DE[facility] OF [facility] [address] [place] [address] Dept: [phone] Dept Fax: [phone]   No orders of the defined types were placed in this encounter.   I,Vanessa Blackburn,acting as a scribe for Wanda VEAR Cornish, MD.,have documented all relevant documentation on the behalf of Wanda VEAR Cornish, MD,as directed by  Wanda VEAR Cornish, MD while in the presence of Wanda VEAR Cornish, MD.

## 2024-05-24 ENCOUNTER — Encounter (HOSPITAL_BASED_OUTPATIENT_CLINIC_OR_DEPARTMENT_OTHER): Payer: Self-pay | Admitting: Emergency Medicine

## 2024-05-24 ENCOUNTER — Ambulatory Visit (HOSPITAL_BASED_OUTPATIENT_CLINIC_OR_DEPARTMENT_OTHER)
Admission: EM | Admit: 2024-05-24 | Discharge: 2024-05-24 | Disposition: A | Discharge status: Home / Self Care | Attending: Family Medicine | Admitting: Family Medicine

## 2024-05-24 DIAGNOSIS — L509 Urticaria, unspecified: Secondary | ICD-10-CM | POA: Diagnosis not present

## 2024-05-24 DIAGNOSIS — L282 Other prurigo: Secondary | ICD-10-CM

## 2024-05-24 MED ORDER — PREDNISONE 20 MG PO TABS: 20.0000 mg | ORAL_TABLET | Freq: Every day | ORAL | 0 refills | Status: AC

## 2024-05-24 MED ORDER — CETIRIZINE HCL 10 MG PO TABS: 10.0000 mg | ORAL_TABLET | Freq: Every day | ORAL | 0 refills | Status: AC | PRN
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
## 2024-05-24 NOTE — ED Provider Notes (Signed)
 [name]    CSN: [id number] Arrival date & time: 05/24/24  1216      History   Chief Complaint Chief Complaint  Patient presents with   Rash    HPI Vanessa Blackburn is a 73 y.o. female.   Patient has a red, irritated, itchy rash that occurred on 05/21/2024.  She was burning some brush and did get, close and felt like her eyebrows got singed at one point.  She does not know if this is something that she was burning that caused the irritation or if she actually got burns on her face but her face itches and is red and she is concerned about the rash.   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   Osteoporosis    Skin cancer     Patient Active Problem List   Diagnosis Date Noted   Osteoporosis 10/23/2020    Class: Chronic   Lymphedema of right upper extremity 09/21/2020    Class: Chronic   Preop cardiovascular exam 11/25/2019   Cardiac murmur 11/25/2019   History of smoking 11/25/2019   History of breast cancer 11/25/2019   Mixed dyslipidemia 11/25/2019   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      Home Medications    Prior to Admission medications   Medication Sig Start Date End Date Taking? Authorizing  Provider  anastrozole  (ARIMIDEX ) 1 MG tablet TAKE 1 TABLET BY MOUTH EVERY DAY 12/03/23  Yes Cornelius Wanda DEL, MD  aspirin 81 MG EC tablet Take by mouth.   Yes [provider]  atorvastatin (LIPITOR) 20 MG tablet Take 20 mg by mouth daily. 02/16/24  Yes [provider]  buPROPion (WELLBUTRIN XL) 150 MG 24 hr tablet  09/10/16  Yes [provider]  cetirizine  (ZYRTEC ) 10 MG tablet Take 1 tablet (10 mg total) by mouth daily as needed for allergies. 05/24/24 06/23/24 Yes Ival Domino, FNP  predniSONE  (DELTASONE ) 20 MG tablet Take 1 tablet (20 mg total) by mouth daily with breakfast for 5 days. 05/24/24 05/29/24 Yes Ival Domino, FNP  pregabalin (LYRICA) 150 MG capsule Take 150 mg by mouth 2 (two) times daily. 03/26/23  Yes [provider]  venlafaxine XR (EFFEXOR-XR) 37.5 MG 24 hr capsule Take 37.5 mg by mouth.   Yes [provider]  albuterol (VENTOLIN HFA) 108 (90 Base) MCG/ACT inhaler Inhale into the lungs.    [provider]  ALPRAZolam (XANAX) 0.5 MG tablet Take 1 tablet by mouth as needed. 08/25/20   [provider]  calcitonin, salmon, (MIACALCIN/FORTICAL) 200 UNIT/ACT nasal spray SPRAY 1 SPRAY ALTERNATE NOSTRIL ONCE DAILY Patient not taking: Reported on 04/12/2024 10/08/23   Mosher, Kelli A, PA-C  CEQUA 0.09 % SOLN Apply  1 drop to eye 2 (two) times daily. 03/14/22   [provider]  diclofenac (VOLTAREN) 75 MG EC tablet Take 75 mg by mouth 2 (two) times daily. Patient not taking: Reported on 04/12/2024 02/03/23   [provider]  DROPLET PEN NEEDLES 31G X 8 MM MISC  07/11/20   [provider]  HYDROcodone-acetaminophen (NORCO/VICODIN) 5-325 MG tablet Take 1 tablet by mouth every 6 (six) hours as needed for moderate pain (pain score 4-6).    [provider]  Multiple Vitamin (MULTIVITAMIN) capsule Take by mouth.    [provider]  mupirocin  ointment (BACTROBAN ) 2 % Apply 1 Application topically 2  (two) times daily. 03/27/24   Ival Domino, FNP  NALOCET 2.5-300 MG per tablet Take by mouth. 02/06/23   [provider]  Niacinamide-Zn-Cu-Methfo-Se-Cr (NICOTINAMIDE PO) Take 500 mg by mouth in the morning and at bedtime.    [provider]  omeprazole (PRILOSEC) 40 MG capsule Take 40 mg by mouth daily. 03/26/23   [provider]  TYRVAYA 0.03 MG/ACT SOLN Place into both nostrils. 03/15/22   [provider]    Family History Family History  Problem Relation Age of Onset   Cancer Mother 47       female cancer - unknown type   Cancer Father 80       throat cancer; +cig and ETOH   Cancer Sister 44       breast   Cancer Sister 35       melanoma on back   Cancer Brother 25       colorectal   Cancer Maternal Aunt        three maternal aunts with breast cancer at young ages   Cancer Paternal Aunt        breast cancer at an unknown age   Cancer Maternal Grandmother        breast cancer at a young age; stomach cancer at an older age   Cancer Maternal Grandfather        brain cancer at an older age   Cancer Paternal Grandmother        bone cancer at an older age    Social History Social History   Tobacco Use   Smoking status: Former    Current packs/day: 0.00    Types: Cigarettes    Quit date: 2015    Years since quitting: 10.5   Smokeless tobacco: Never  Substance Use Topics   Alcohol use: Never   Drug use: Never     Allergies   Ciprofloxacin, Codeine, Levofloxacin, and Moxifloxacin   Review of Systems Review of Systems  Constitutional:  Negative for chills and fever.  HENT:  Negative for ear pain and sore throat.   Eyes:  Negative for pain and visual disturbance.  Respiratory:  Negative for cough and shortness of breath.   Cardiovascular:  Negative for chest pain and palpitations.  Gastrointestinal:  Negative for abdominal pain, constipation, diarrhea, nausea and vomiting.  Genitourinary:  Negative for dysuria and hematuria.   Musculoskeletal:  Negative for arthralgias and back pain.  Skin:  Positive for rash. Negative for color change.  Neurological:  Negative for seizures and syncope.  All other systems reviewed and are negative.    Physical Exam Triage Vital Signs ED Triage Vitals [05/24/24 1225]  Encounter Vitals Group     BP      Girls Systolic BP Percentile      Girls Diastolic BP Percentile  Boys Systolic BP Percentile      Boys Diastolic BP Percentile      Pulse      Resp      Temp      Temp src      SpO2      Weight      Height      Head Circumference      Peak Flow      Pain Score 0     Pain Loc      Pain Education      Exclude from Growth Chart    No data found.  Updated Vital Signs BP 137/82 (BP Location: Left Arm)   Pulse 79   Temp 98.8 F (37.1 C) (Oral)   Resp 18   LMP  (LMP Unknown)   SpO2 97%   Visual Acuity Right Eye Distance:   Left Eye Distance:   Bilateral Distance:    Right Eye Near:   Left Eye Near:    Bilateral Near:     Physical Exam Vitals and nursing note reviewed.  Constitutional:      General: She is not in acute distress.    Appearance: She is well-developed. She is not ill-appearing or toxic-appearing.  HENT:     Head: Normocephalic and atraumatic.     Right Ear: Hearing, tympanic membrane, ear canal and external ear normal.     Left Ear: Hearing, tympanic membrane, ear canal and external ear normal.     Nose: No congestion or rhinorrhea.     Right Sinus: No maxillary sinus tenderness or frontal sinus tenderness.     Left Sinus: No maxillary sinus tenderness or frontal sinus tenderness.     Mouth/Throat:     Lips: Pink.     Mouth: Mucous membranes are moist.     Pharynx: Uvula midline. No oropharyngeal exudate or posterior oropharyngeal erythema.     Tonsils: No tonsillar exudate.  Eyes:     Conjunctiva/sclera: Conjunctivae normal.     Pupils: Pupils are equal, round, and reactive to light.  Cardiovascular:     Rate and Rhythm:  Normal rate and regular rhythm.     Heart sounds: S1 normal and S2 normal. No murmur heard. Pulmonary:     Effort: Pulmonary effort is normal. No respiratory distress.     Breath sounds: Normal breath sounds. No decreased breath sounds, wheezing, rhonchi or rales.  Abdominal:     General: Bowel sounds are normal.     Palpations: Abdomen is soft.     Tenderness: There is no abdominal tenderness.  Musculoskeletal:        General: No swelling.     Cervical back: Neck supple.  Lymphadenopathy:     Head:     Right side of head: No submental, submandibular, tonsillar, preauricular or posterior auricular adenopathy.     Left side of head: No submental, submandibular, tonsillar, preauricular or posterior auricular adenopathy.     Cervical: No cervical adenopathy.     Right cervical: No superficial cervical adenopathy.    Left cervical: No superficial cervical adenopathy.  Skin:    General: Skin is warm and dry.     Capillary Refill: Capillary refill takes less than 2 seconds.     Findings: Rash (Urticarial wheals on the face.  See photo for more information.) present.  Neurological:     Mental Status: She is alert and oriented to person, place, and time.  Psychiatric:        Mood and  Affect: Mood normal.      UC Treatments / Results  Labs (all labs ordered are listed, but only abnormal results are displayed) Labs Reviewed - No data to display  EKG   Radiology No results found.  Procedures Procedures (including critical care time)  Medications Ordered in UC Medications - No data to display  Initial Impression / Assessment and Plan / UC Course  I have reviewed the triage vital signs and the nursing notes.  Pertinent labs & imaging results that were available during my care of the patient were reviewed by me and considered in my medical decision making (see chart for details).  Plan of Care: Urticarial rash with itching: Prednisone  20 mg daily for 5 days.  Cetirizine  10 mg  daily to help with the rash and for itching.  If rash does not resolve see primary care.  May need referral to dermatology.  I reviewed the plan of care with the patient and/or the patient's guardian.  The patient and/or guardian had time to ask questions and acknowledged that the questions were answered.  I provided instruction on symptoms or reasons to return here or to go to an ER, if symptoms/condition did not improve, worsened or if new symptoms occurred.  Final Clinical Impressions(s) / UC Diagnoses   Final diagnoses:  Urticarial rash  Pruritic rash     Discharge Instructions      Urticarial rash with itching: Patient's facial rash is hives until proven differently.  Prednisone  20 mg daily for 5 days.  Encouraged use of cetirizine  10 mg daily for itching and to help with the rash.  This is available over-the-counter.  Follow-up with primary care and may need referral to dermatology if rash does not improve.     ED Prescriptions     Medication Sig Dispense Auth. Provider   predniSONE  (DELTASONE ) 20 MG tablet Take 1 tablet (20 mg total) by mouth daily with breakfast for 5 days. 5 tablet Horst Ostermiller, FNP   cetirizine  (ZYRTEC ) 10 MG tablet Take 1 tablet (10 mg total) by mouth daily as needed for allergies. 30 tablet Ajai Harville, FNP      PDMP not reviewed this encounter.   Ival Domino, FNP 05/24/24 1256

## 2024-05-24 NOTE — Discharge Instructions (Addendum)
 Urticarial rash with itching: Patient's facial rash is hives until proven differently.  Prednisone  20 mg daily for 5 days.  Encouraged use of cetirizine  10 mg daily for itching and to help with the rash.  This is available over-the-counter.  Follow-up with primary care and may need referral to dermatology if rash does not improve.

## 2024-06-02 ENCOUNTER — Encounter: Payer: Self-pay | Admitting: Oncology

## 2024-06-03 ENCOUNTER — Ambulatory Visit (HOSPITAL_BASED_OUTPATIENT_CLINIC_OR_DEPARTMENT_OTHER)
Admission: RE | Admit: 2024-06-03 | Discharge: 2024-06-03 | Disposition: A | Discharge status: Home / Self Care | Source: Ambulatory Visit | Attending: Oncology | Admitting: Oncology

## 2024-06-03 DIAGNOSIS — C50311 Malignant neoplasm of lower-inner quadrant of right female breast: Secondary | ICD-10-CM

## 2024-06-03 DIAGNOSIS — Z170 Estrogen receptor positive status [ER+]: Secondary | ICD-10-CM

## 2024-06-03 MED ORDER — GADOBUTROL 1 MMOL/ML IV SOLN
6.6000 mL | Freq: Once | INTRAVENOUS | Status: AC | PRN
  Administered 2024-06-03: 6.6 mL via INTRAVENOUS

## 2024-06-30 ENCOUNTER — Encounter (HOSPITAL_BASED_OUTPATIENT_CLINIC_OR_DEPARTMENT_OTHER): Payer: Self-pay | Admitting: Emergency Medicine

## 2024-06-30 ENCOUNTER — Ambulatory Visit (HOSPITAL_BASED_OUTPATIENT_CLINIC_OR_DEPARTMENT_OTHER)
Admission: EM | Admit: 2024-06-30 | Discharge: 2024-06-30 | Disposition: A | Discharge status: Home / Self Care | Attending: Family Medicine | Admitting: Family Medicine

## 2024-06-30 DIAGNOSIS — N390 Urinary tract infection, site not specified: Secondary | ICD-10-CM | POA: Insufficient documentation

## 2024-06-30 DIAGNOSIS — R319 Hematuria, unspecified: Secondary | ICD-10-CM | POA: Insufficient documentation

## 2024-06-30 DIAGNOSIS — R310 Gross hematuria: Secondary | ICD-10-CM | POA: Insufficient documentation

## 2024-06-30 DIAGNOSIS — R300 Dysuria: Secondary | ICD-10-CM | POA: Insufficient documentation

## 2024-06-30 LAB — POCT URINE DIPSTICK
Bilirubin, UA: NEGATIVE
Glucose, UA: NEGATIVE mg/dL
Ketones, POC UA: NEGATIVE mg/dL
Nitrite, UA: NEGATIVE
Protein Ur, POC: >=300 mg/dL — AB
Spec Grav, UA: 1.025 (ref 1.010–1.025)
Urobilinogen, UA: 0.2 U/dL
pH, UA: 8.5 — AB (ref 5.0–8.0)

## 2024-06-30 MED ORDER — NITROFURANTOIN MONOHYD MACRO 100 MG PO CAPS: 100.0000 mg | ORAL_CAPSULE | Freq: Two times a day (BID) | ORAL | 0 refills | Status: AC

## 2024-06-30 MED ORDER — PHENAZOPYRIDINE HCL 200 MG PO TABS: 200.0000 mg | ORAL_TABLET | Freq: Three times a day (TID) | ORAL | 0 refills | Status: AC

## 2024-06-30 NOTE — ED Triage Notes (Signed)
 Pt reports she has urinary burning and frequency started a few days ago. Pt has clots of blood too and she reports it feels like her insides are coming out .

## 2024-06-30 NOTE — Discharge Instructions (Addendum)
 UTI with dysuria: UA is abnormal.  Culture sent.  Nitrofurantoin , 100 mg, twice daily for 7 days.  Get plenty of fluids and rest.  Pyridium  200 mg, 1 pill every 8 hours if needed for bladder pain.  Will contact patient if we need to make any changes once the urine culture results.  Follow-up if symptoms do not improve, worsen or new symptoms occur.

## 2024-06-30 NOTE — ED Provider Notes (Signed)
 [name]    CSN: [id number] Arrival date & time: 06/30/24  1702      History   Chief Complaint Chief Complaint  Patient presents with   Dysuria   Urinary Frequency    HPI Vanessa Blackburn is a 73 y.o. female.   73 year old female with urinary frequency and burning that started on 06/27/2024.  She is seeing visible blood in her urine and is having frequency and urgency.   Dysuria Associated symptoms: no abdominal pain, no fever, no nausea and no vomiting   Urinary Frequency Pertinent negatives include no chest pain and no abdominal pain.    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      Home Medications    Prior to Admission medications   Medication Sig Start Date End Date Taking? Authorizing Provider  nitrofurantoin , macrocrystal-monohydrate, (MACROBID ) 100 MG capsule Take 1 capsule (100 mg total) by mouth 2 (two) times daily for 7 days. 06/30/24  07/07/24 Yes Ival Domino, FNP  phenazopyridine  (PYRIDIUM ) 200 MG tablet Take 1 tablet (200 mg total) by mouth 3 (three) times daily. 06/30/24  Yes Ival Domino, FNP  albuterol (VENTOLIN HFA) 108 (90 Base) MCG/ACT inhaler Inhale into the lungs.    [provider]  ALPRAZolam (XANAX) 0.5 MG tablet Take 1 tablet by mouth as needed. 08/25/20   [provider]  anastrozole  (ARIMIDEX ) 1 MG tablet TAKE 1 TABLET BY MOUTH EVERY DAY 12/03/23   Vanessa Wanda DEL, MD  aspirin 81 MG EC tablet Take by mouth.    [provider]  atorvastatin (LIPITOR) 20 MG tablet Take 20 mg by mouth daily. 02/16/24   [provider]  buPROPion (WELLBUTRIN XL) 150 MG 24 hr tablet  09/10/16   [provider]  calcitonin, salmon, (MIACALCIN/FORTICAL) 200 UNIT/ACT nasal spray SPRAY 1 SPRAY ALTERNATE NOSTRIL ONCE DAILY Patient not taking: Reported on 04/12/2024 10/08/23   Mosher, Kelli A, PA-C  CEQUA 0.09 % SOLN Apply 1 drop to eye 2 (two) times daily. 03/14/22   [provider]  cetirizine  (ZYRTEC ) 10 MG tablet Take 1 tablet (10 mg total) by mouth daily as needed for allergies. 05/24/24 06/23/24  Ival Domino, FNP  diclofenac (VOLTAREN) 75 MG EC tablet Take 75 mg by mouth 2 (two) times daily. Patient not taking: Reported on 04/12/2024 02/03/23   [provider]  DROPLET PEN NEEDLES 31G X 8 MM MISC  07/11/20  [provider]  HYDROcodone-acetaminophen (NORCO/VICODIN) 5-325 MG tablet Take 1 tablet by mouth every 6 (six) hours as needed for moderate pain (pain score 4-6).    [provider]  Multiple Vitamin (MULTIVITAMIN) capsule Take by mouth.    [provider]  mupirocin  ointment (BACTROBAN ) 2 % Apply 1 Application topically 2 (two) times daily. 03/27/24   Ival Domino, FNP  NALOCET 2.5-300 MG per tablet Take by mouth. 02/06/23   [provider]  Niacinamide-Zn-Cu-Methfo-Se-Cr (NICOTINAMIDE PO) Take 500 mg by mouth in the morning and at  bedtime.    [provider]  omeprazole (PRILOSEC) 40 MG capsule Take 40 mg by mouth daily. 03/26/23   [provider]  pregabalin (LYRICA) 150 MG capsule Take 150 mg by mouth 2 (two) times daily. 03/26/23   [provider]  TYRVAYA 0.03 MG/ACT SOLN Place into both nostrils. 03/15/22   [provider]  venlafaxine XR (EFFEXOR-XR) 37.5 MG 24 hr capsule Take 37.5 mg by mouth.    [provider]    Family History Family History  Problem Relation Age of Onset   Cancer Mother 69       female cancer - unknown type   Cancer Father 37       throat cancer; +cig and ETOH   Cancer Sister 56       breast   Cancer Sister 34       melanoma on back   Cancer Brother 25       colorectal   Cancer Maternal Aunt        three maternal aunts with breast cancer at young ages   Cancer Paternal Aunt        breast cancer at an unknown age   Cancer Maternal Grandmother        breast cancer at a young age; stomach cancer at an older age   Cancer Maternal Grandfather        brain cancer at an older age   Cancer Paternal Grandmother        bone cancer at an older age    Social History Social History   Tobacco Use   Smoking status: Former    Current packs/day: 0.00    Types: Cigarettes    Quit date: 2015    Years since quitting: 10.6   Smokeless tobacco: Never  Substance Use Topics   Alcohol use: Never   Drug use: Never     Allergies   Ciprofloxacin, Codeine, Levofloxacin, and Moxifloxacin   Review of Systems Review of Systems  Constitutional:  Negative for fever.  Respiratory:  Negative for cough.   Cardiovascular:  Negative for chest pain.  Gastrointestinal:  Negative for abdominal pain, constipation, diarrhea, nausea and vomiting.  Genitourinary:  Positive for dysuria, frequency and hematuria.  Musculoskeletal:  Negative for arthralgias and back pain.  Skin:  Negative for color change and rash.  Neurological:  Negative for syncope.  All other  systems reviewed and are negative.    Physical Exam Triage Vital Signs ED Triage Vitals [06/30/24 1802]  Encounter Vitals Group     BP (!) 155/77     Girls Systolic BP Percentile      Girls Diastolic BP Percentile      Boys Systolic BP Percentile      Boys Diastolic BP Percentile      Pulse Rate 75     Resp 20     Temp 98.8 F (37.1 C)     Temp Source  Oral     SpO2 99 %     Weight      Height      Head Circumference      Peak Flow      Pain Score 0     Pain Loc      Pain Education      Exclude from Growth Chart    No data found.  Updated Vital Signs BP (!) 155/77 (BP Location: Left Arm)   Pulse 75   Temp 98.8 F (37.1 C) (Oral)   Resp 20   LMP  (LMP Unknown)   SpO2 99%   Visual Acuity Right Eye Distance:   Left Eye Distance:   Bilateral Distance:    Right Eye Near:   Left Eye Near:    Bilateral Near:     Physical Exam Vitals and nursing note reviewed.  Constitutional:      General: She is not in acute distress.    Appearance: She is well-developed. She is not ill-appearing or toxic-appearing.  HENT:     Head: Normocephalic and atraumatic.     Right Ear: External ear normal.     Left Ear: External ear normal.     Nose: Nose normal.     Mouth/Throat:     Lips: Pink.     Mouth: Mucous membranes are moist.  Eyes:     Conjunctiva/sclera: Conjunctivae normal.     Pupils: Pupils are equal, round, and reactive to light.  Cardiovascular:     Rate and Rhythm: Normal rate and regular rhythm.     Heart sounds: S1 normal and S2 normal. No murmur heard. Pulmonary:     Effort: Pulmonary effort is normal. No respiratory distress.     Breath sounds: Normal breath sounds. No decreased breath sounds, wheezing, rhonchi or rales.  Abdominal:     General: Bowel sounds are normal.     Palpations: Abdomen is soft.     Tenderness: There is abdominal tenderness in the epigastric area and suprapubic area. There is no right CVA tenderness, left CVA tenderness, guarding or  rebound. Negative signs include Murphy's sign, Rovsing's sign and McBurney's sign.  Musculoskeletal:        General: No swelling.  Skin:    General: Skin is warm and dry.     Capillary Refill: Capillary refill takes less than 2 seconds.     Findings: No rash.  Neurological:     Mental Status: She is alert and oriented to person, place, and time.  Psychiatric:        Mood and Affect: Mood normal.      UC Treatments / Results  Labs (all labs ordered are listed, but only abnormal results are displayed) Labs Reviewed  POCT URINE DIPSTICK - Abnormal; Notable for the following components:      Result Value   Color, UA other (*)    Clarity, UA hazy (*)    Blood, UA large (*)    pH, UA 8.5 (*)    Protein Ur, POC >=300 (*)    Leukocytes, UA Small (1+) (*)    All other components within normal limits  URINE CULTURE    EKG   Radiology No results found.  Procedures Procedures (including critical care time)  Medications Ordered in UC Medications - No data to display  Initial Impression / Assessment and Plan / UC Course  I have reviewed the triage vital signs and the nursing notes.  Pertinent labs & imaging results that were available  during my care of the patient were reviewed by me and considered in my medical decision making (see chart for details).  Plan of Care: Dysuria and UTI with gross hematuria: UA is abnormal.  Culture sent.  Will adjust the plan of care, if needed once the culture results.  Nitrofurantoin  100 mg twice daily for 7 days.  Pyridium  200 mg every 8 hours if needed for bladder pain.  Get plenty of fluids and rest.  Follow-up if symptoms do not improve, worsen or new symptoms occur.  Encouraged to see family doctor and discuss urology referral.  Believe the hematuria needs further workup.  I reviewed the plan of care with the patient and/or the patient's guardian.  The patient and/or guardian had time to ask questions and acknowledged that the questions  were answered.  I provided instruction on symptoms or reasons to return here or to go to an ER, if symptoms/condition did not improve, worsened or if new symptoms occurred.  Final Clinical Impressions(s) / UC Diagnoses   Final diagnoses:  Dysuria  Urinary tract infection with hematuria, site unspecified  Gross hematuria     Discharge Instructions      UTI with dysuria: UA is abnormal.  Culture sent.  Nitrofurantoin , 100 mg, twice daily for 7 days.  Get plenty of fluids and rest.  Pyridium  200 mg, 1 pill every 8 hours if needed for bladder pain.  Will contact patient if we need to make any changes once the urine culture results.  Follow-up if symptoms do not improve, worsen or new symptoms occur.    ED Prescriptions     Medication Sig Dispense Auth. Provider   nitrofurantoin , macrocrystal-monohydrate, (MACROBID ) 100 MG capsule Take 1 capsule (100 mg total) by mouth 2 (two) times daily for 7 days. 14 capsule Ival Domino, FNP   phenazopyridine  (PYRIDIUM ) 200 MG tablet Take 1 tablet (200 mg total) by mouth 3 (three) times daily. 6 tablet Morad Tal, FNP      PDMP not reviewed this encounter.   Ival Domino, FNP 06/30/24 1911

## 2024-07-01 ENCOUNTER — Telehealth (HOSPITAL_BASED_OUTPATIENT_CLINIC_OR_DEPARTMENT_OTHER): Payer: Self-pay | Admitting: Family Medicine

## 2024-07-01 ENCOUNTER — Encounter (HOSPITAL_BASED_OUTPATIENT_CLINIC_OR_DEPARTMENT_OTHER): Payer: Self-pay | Admitting: Family Medicine

## 2024-07-01 MED ORDER — SULFAMETHOXAZOLE-TRIMETHOPRIM 800-160 MG PO TABS
1.0000 | ORAL_TABLET | Freq: Two times a day (BID) | ORAL | 0 refills | Status: AC
Start: 2024-07-01 — End: 2024-07-08

## 2024-07-01 NOTE — Telephone Encounter (Signed)
 The patient contacted us  here at the urgent care and let us  know that nitrofurantoin  is giving her abdominal upset and nausea and vomiting.  She wants to stop that antibiotic and switch to something else.  But she also let us  know that she is out of minutes and she can text but she cannot call.  We do not have a method to text her but she will get a MyChart notification about this medication change and hopefully her pharmacy will also notify her via text.  Going to stop the nitrofurantoin  and switch to Bactrim  DS (sulfa  trimethoprim ) 1 pill twice daily for 7 days.

## 2024-07-02 ENCOUNTER — Ambulatory Visit (HOSPITAL_COMMUNITY): Payer: Self-pay

## 2024-07-02 ENCOUNTER — Telehealth (HOSPITAL_BASED_OUTPATIENT_CLINIC_OR_DEPARTMENT_OTHER): Payer: Self-pay

## 2024-07-02 LAB — URINE CULTURE: Culture: NO GROWTH

## 2024-07-02 NOTE — Telephone Encounter (Signed)
 Pt called requesting to speak with a nurse regarding her lab results.

## 2024-07-05 NOTE — Progress Notes (Signed)
 Culture is negative.  Unable to reach the patient but I was able to leave her a voicemail message and encouraged her to stop the nitrofurantoin  as her culture was negative.  I did give her our phone number to call back if she had any questions.

## 2024-07-08 NOTE — Progress Notes (Signed)
 No auth needed order placed today scan sent via [employer] [place]  Breeze right ankle AFO   Need Financial form signed   Lolita Schultze CPed, CFo, Cfm

## 2024-07-22 ENCOUNTER — Other Ambulatory Visit: Payer: Self-pay | Admitting: Medical Genetics

## 2024-07-22 DIAGNOSIS — Z006 Encounter for examination for normal comparison and control in clinical research program: Secondary | ICD-10-CM

## 2024-07-30 ENCOUNTER — Telehealth: Payer: Self-pay

## 2024-07-30 NOTE — Telephone Encounter (Signed)
 LVM to schedule brace fitting/ deliver

## 2024-08-04 ENCOUNTER — Ambulatory Visit (INDEPENDENT_AMBULATORY_CARE_PROVIDER_SITE_OTHER): Admit: 2024-08-04 | Discharge: 2024-08-04 | Disposition: A | Discharge status: Home / Self Care | Admitting: Radiology

## 2024-08-04 ENCOUNTER — Encounter (HOSPITAL_BASED_OUTPATIENT_CLINIC_OR_DEPARTMENT_OTHER): Payer: Self-pay | Admitting: Emergency Medicine

## 2024-08-04 ENCOUNTER — Ambulatory Visit (HOSPITAL_BASED_OUTPATIENT_CLINIC_OR_DEPARTMENT_OTHER)
Admission: EM | Admit: 2024-08-04 | Discharge: 2024-08-04 | Disposition: A | Discharge status: Home / Self Care | Attending: Family Medicine | Admitting: Family Medicine

## 2024-08-04 DIAGNOSIS — M25512 Pain in left shoulder: Secondary | ICD-10-CM

## 2024-08-04 DIAGNOSIS — W19XXXA Unspecified fall, initial encounter: Secondary | ICD-10-CM

## 2024-08-04 NOTE — ED Provider Notes (Signed)
 [name]    CSN: [id number] Arrival date & time: 08/04/24  1226      History   Chief Complaint No chief complaint on file.   HPI Vanessa Blackburn is a 73 y.o. female.   Pt is a 73 year old female that presents with left shoulder pain, injury after a fall. Clemens this am landing on the left shoulder. Very limited ROM. No numbness, tingling.      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      Home Medications    Prior to Admission medications   Medication Sig Start Date End Date Taking? Authorizing Provider  albuterol (VENTOLIN HFA) 108 (90 Base) MCG/ACT inhaler Inhale into the lungs.    [provider]  ALPRAZolam (XANAX) 0.5 MG tablet Take 1 tablet by mouth as needed. 08/25/20   [provider]  anastrozole  (ARIMIDEX ) 1 MG tablet TAKE 1 TABLET BY MOUTH EVERY DAY 12/03/23   Cornelius Wanda DEL, MD  aspirin 81 MG EC tablet Take by mouth.     [provider]  atorvastatin (LIPITOR) 20 MG tablet Take 20 mg by mouth daily. 02/16/24   [provider]  buPROPion (WELLBUTRIN XL) 150 MG 24 hr tablet  09/10/16   [provider]  calcitonin, salmon, (MIACALCIN/FORTICAL) 200 UNIT/ACT nasal spray SPRAY 1 SPRAY ALTERNATE NOSTRIL ONCE DAILY Patient not taking: Reported on 04/12/2024 10/08/23   Mosher, Kelli A, PA-C  CEQUA 0.09 % SOLN Apply 1 drop to eye 2 (two) times daily. 03/14/22   [provider]  cetirizine  (ZYRTEC ) 10 MG tablet Take 1 tablet (10 mg total) by mouth daily as needed for allergies. 05/24/24 06/23/24  Ival Domino, FNP  diclofenac (VOLTAREN) 75 MG EC tablet Take 75 mg by mouth 2 (two) times daily. Patient not taking: Reported on 04/12/2024 02/03/23   [provider]  DROPLET PEN NEEDLES 31G X 8 MM MISC  07/11/20   [provider]  HYDROcodone-acetaminophen (NORCO/VICODIN) 5-325 MG tablet Take 1 tablet by mouth every 6 (six) hours as needed for moderate pain (pain score 4-6).    [provider]  Multiple Vitamin (MULTIVITAMIN) capsule Take by mouth.    [provider]  mupirocin  ointment (BACTROBAN ) 2 % Apply 1 Application topically 2 (two) times daily. 03/27/24   Ival Domino, FNP  NALOCET 2.5-300 MG per tablet Take by mouth. 02/06/23   [provider]  Niacinamide-Zn-Cu-Methfo-Se-Cr (  NICOTINAMIDE PO) Take 500 mg by mouth in the morning and at bedtime.    [provider]  omeprazole (PRILOSEC) 40 MG capsule Take 40 mg by mouth daily. 03/26/23   [provider]  phenazopyridine  (PYRIDIUM ) 200 MG tablet Take 1 tablet (200 mg total) by mouth 3 (three) times daily. 06/30/24   Ival Domino, FNP  pregabalin (LYRICA) 150 MG capsule Take 150 mg by mouth 2 (two) times daily. 03/26/23   [provider]  TYRVAYA 0.03 MG/ACT SOLN Place into both nostrils. 03/15/22   [provider]  venlafaxine XR (EFFEXOR-XR) 37.5 MG 24 hr capsule Take 37.5  mg by mouth.    [provider]    Family History Family History  Problem Relation Age of Onset   Cancer Mother 58       female cancer - unknown type   Cancer Father 76       throat cancer; +cig and ETOH   Cancer Sister 41       breast   Cancer Sister 22       melanoma on back   Cancer Brother 25       colorectal   Cancer Maternal Aunt        three maternal aunts with breast cancer at young ages   Cancer Paternal Aunt        breast cancer at an unknown age   Cancer Maternal Grandmother        breast cancer at a young age; stomach cancer at an older age   Cancer Maternal Grandfather        brain cancer at an older age   Cancer Paternal Grandmother        bone cancer at an older age    Social History Social History   Tobacco Use   Smoking status: Former    Current packs/day: 0.00    Types: Cigarettes    Quit date: 2015    Years since quitting: 10.7   Smokeless tobacco: Never  Substance Use Topics   Alcohol use: Never   Drug use: Never     Allergies   Ciprofloxacin, Codeine, Levofloxacin, and Moxifloxacin   Review of Systems Review of Systems See HPI  Physical Exam Triage Vital Signs ED Triage Vitals  Encounter Vitals Group     BP 08/04/24 1239 (!) 134/92     Girls Systolic BP Percentile --      Girls Diastolic BP Percentile --      Boys Systolic BP Percentile --      Boys Diastolic BP Percentile --      Pulse Rate 08/04/24 1239 80     Resp 08/04/24 1239 18     Temp 08/04/24 1239 (!) 97.4 F (36.3 C)     Temp Source 08/04/24 1239 Oral     SpO2 08/04/24 1239 94 %     Weight --      Height --      Head Circumference --      Peak Flow --      Pain Score 08/04/24 1238 10     Pain Loc --      Pain Education --      Exclude from Growth Chart --    No data found.  Updated Vital Signs BP (!) 134/92 (BP Location: Left Wrist)   Pulse 80   Temp (!) 97.4 F (36.3 C) (Oral)   Resp 18   LMP  (LMP Unknown)   SpO2 94%  Visual Acuity Right  Eye Distance:   Left Eye Distance:   Bilateral Distance:    Right Eye Near:   Left Eye Near:    Bilateral Near:     Physical Exam Vitals and nursing note reviewed.  Constitutional:      General: She is not in acute distress.    Appearance: Normal appearance. She is not ill-appearing, toxic-appearing or diaphoretic.  Pulmonary:     Effort: Pulmonary effort is normal.  Musculoskeletal:        General: Tenderness present. No deformity or signs of injury.     Comments: Generalized tenderness to the left shoulder with very limited ROM in all exercises.   Neurological:     Mental Status: She is alert.  Psychiatric:        Mood and Affect: Mood normal.      UC Treatments / Results  Labs (all labs ordered are listed, but only abnormal results are displayed) Labs Reviewed - No data to display  EKG   Radiology DG Shoulder Left Result Date: 08/04/2024 CLINICAL DATA:  Fall with shoulder pain. EXAM: LEFT SHOULDER - 2+ VIEW COMPARISON:  None Available. FINDINGS: No evidence for an acute fracture. No shoulder separation or dislocation. Mild degenerative changes noted acromioclavicular joint. Bones are diffusely demineralized. IMPRESSION: 1. No acute bony findings. 2. Mild degenerative changes in the acromioclavicular joint. Electronically Signed   By: Camellia Candle M.D.   On: 08/04/2024 13:15    Procedures Procedures (including critical care time)  Medications Ordered in UC Medications - No data to display  Initial Impression / Assessment and Plan / UC Course  I have reviewed the triage vital signs and the nursing notes.  Pertinent labs & imaging results that were available during my care of the patient were reviewed by me and considered in my medical decision making (see chart for details).     Left shoulder pain- no concerns on xray for dislocation or fracture. Concern for torn rotator cuff based on ROM limitations. Recommend follow up with orthopedics if the pain doesn't  improve.  Rest, Ice, Elevate, OTC pain meds as needed.  Final Clinical Impressions(s) / UC Diagnoses   Final diagnoses:  Pain in joint of left shoulder     Discharge Instructions      Your xrays were normal. Most likely sprained. You can apply ice to the area. Rest. You can take OTC pain relief as needed.     ED Prescriptions   None    PDMP not reviewed this encounter.   Vanessa Wilbert LABOR, FNP 08/04/24 1345

## 2024-08-04 NOTE — ED Triage Notes (Signed)
 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

## 2024-08-04 NOTE — Discharge Instructions (Signed)
 Your xrays were normal. Most likely sprained. You can apply ice to the area. Rest. You can take OTC pain relief as needed.

## 2024-08-09 LAB — GENECONNECT MOLECULAR SCREEN

## 2024-08-10 ENCOUNTER — Other Ambulatory Visit: Payer: Self-pay | Admitting: Medical Genetics

## 2024-08-10 ENCOUNTER — Telehealth: Payer: Self-pay | Admitting: Medical Genetics

## 2024-08-10 DIAGNOSIS — Z006 Encounter for examination for normal comparison and control in clinical research program: Secondary | ICD-10-CM

## 2024-08-10 NOTE — Telephone Encounter (Signed)
 [facility]  08/10/24 2:04 PM  Confirmed I was speaking with Vanessa Blackburn [phone] by using name and DOB. Informed participant the reason for this call is to follow-up on a recent sample the participant provided at one of the [facility] lab locations. Informed participant the test was not able to be completed with this sample and apologized for the inconvenience. Participant was requested to provide a new sample at one of our participating labs at no cost so that participant can continue participation and receive test results. Participant agreed to provide another sample. Participant was provided the [employer] program website to learn why this may have happened. Participant was thanked for their time and continued support of the above study.

## 2024-08-13 NOTE — Telephone Encounter (Signed)
 LVM to schedule brace fitting  2nd attempt

## 2024-08-18 NOTE — Telephone Encounter (Signed)
 Pt called back, she is scheduled to come in 09/07/24 at 2:15 in  [place] location to pick up brace.

## 2024-08-23 ENCOUNTER — Other Ambulatory Visit: Payer: Self-pay | Admitting: Medical Genetics

## 2024-08-23 DIAGNOSIS — Z006 Encounter for examination for normal comparison and control in clinical research program: Secondary | ICD-10-CM

## 2024-08-24 ENCOUNTER — Other Ambulatory Visit: Payer: Self-pay | Admitting: Medical Genetics

## 2024-08-24 DIAGNOSIS — Z006 Encounter for examination for normal comparison and control in clinical research program: Secondary | ICD-10-CM

## 2024-08-24 NOTE — Addendum Note (Signed)
 Addended by: REMUS NOEMI PARAS on: 08/24/2024 12:58 PM   Modules accepted: Orders

## 2024-09-07 ENCOUNTER — Other Ambulatory Visit

## 2024-09-07 LAB — GENECONNECT MOLECULAR SCREEN: Genetic Analysis Overall Interpretation: NEGATIVE

## 2024-09-21 ENCOUNTER — Ambulatory Visit: Admitting: Podiatry

## 2024-11-04 ENCOUNTER — Telehealth: Payer: Self-pay

## 2024-11-04 NOTE — Telephone Encounter (Signed)
 Patients daughter Sueanne voiced she has spoken with you prior regarding supplements . Lions mane mushroom, turkey tail mushroom and red reichi. She would like her mother to be able to take these for memory and arthritis pain. She was told in the past this was not a good idea supplements not FDA approved ingredients not regulated. She is adamant about speaking to you about these again voiced only ingredients were mushrooms ,cane sugar and water in tinctures from apothecary.

## 2024-11-17 ENCOUNTER — Other Ambulatory Visit: Payer: Self-pay

## 2024-11-17 DIAGNOSIS — Z170 Estrogen receptor positive status [ER+]: Secondary | ICD-10-CM

## 2024-11-25 ENCOUNTER — Telehealth: Payer: Self-pay | Admitting: Podiatrist

## 2024-11-25 NOTE — Telephone Encounter (Signed)
 Patient called in stating that she wants to cancel her order for her foot brace.

## 2024-11-30 ENCOUNTER — Other Ambulatory Visit

## 2025-05-20 ENCOUNTER — Ambulatory Visit: Admitting: Oncology

## 2025-05-20 ENCOUNTER — Other Ambulatory Visit
